# Patient Record
Sex: Female | Born: 1975 | Race: Black or African American | Hispanic: No | Marital: Single | Smoking: Never smoker
Health system: Southern US, Community
[De-identification: ages and names within clinical notes are randomized; demographics above are authoritative.]

## PROBLEM LIST (undated history)

## (undated) DIAGNOSIS — D219 Benign neoplasm of connective and other soft tissue, unspecified: Secondary | ICD-10-CM

## (undated) DIAGNOSIS — R600 Localized edema: Secondary | ICD-10-CM

## (undated) DIAGNOSIS — N83209 Unspecified ovarian cyst, unspecified side: Secondary | ICD-10-CM

## (undated) DIAGNOSIS — M2550 Pain in unspecified joint: Secondary | ICD-10-CM

## (undated) DIAGNOSIS — E785 Hyperlipidemia, unspecified: Secondary | ICD-10-CM

## (undated) DIAGNOSIS — M549 Dorsalgia, unspecified: Secondary | ICD-10-CM

## (undated) DIAGNOSIS — R030 Elevated blood-pressure reading, without diagnosis of hypertension: Secondary | ICD-10-CM

## (undated) DIAGNOSIS — F32A Depression, unspecified: Secondary | ICD-10-CM

## (undated) DIAGNOSIS — E041 Nontoxic single thyroid nodule: Secondary | ICD-10-CM

## (undated) DIAGNOSIS — J302 Other seasonal allergic rhinitis: Secondary | ICD-10-CM

## (undated) DIAGNOSIS — F419 Anxiety disorder, unspecified: Secondary | ICD-10-CM

## (undated) DIAGNOSIS — N946 Dysmenorrhea, unspecified: Secondary | ICD-10-CM

## (undated) HISTORY — DX: Elevated blood-pressure reading, without diagnosis of hypertension: R03.0

## (undated) HISTORY — PX: CERVICAL CERCLAGE: SHX1329

## (undated) HISTORY — DX: Unspecified ovarian cyst, unspecified side: N83.209

## (undated) HISTORY — DX: Dorsalgia, unspecified: M54.9

## (undated) HISTORY — DX: Benign neoplasm of connective and other soft tissue, unspecified: D21.9

## (undated) HISTORY — DX: Nontoxic single thyroid nodule: E04.1

## (undated) HISTORY — DX: Localized edema: R60.0

## (undated) HISTORY — DX: Anxiety disorder, unspecified: F41.9

## (undated) HISTORY — DX: Pain in unspecified joint: M25.50

## (undated) HISTORY — DX: Depression, unspecified: F32.A

## (undated) HISTORY — DX: Dysmenorrhea, unspecified: N94.6

## (undated) HISTORY — DX: Hyperlipidemia, unspecified: E78.5

## (undated) HISTORY — DX: Other seasonal allergic rhinitis: J30.2

---

## 2016-07-02 DIAGNOSIS — N946 Dysmenorrhea, unspecified: Secondary | ICD-10-CM | POA: Insufficient documentation

## 2016-08-07 DIAGNOSIS — E063 Autoimmune thyroiditis: Secondary | ICD-10-CM | POA: Insufficient documentation

## 2017-04-10 DIAGNOSIS — R635 Abnormal weight gain: Secondary | ICD-10-CM | POA: Insufficient documentation

## 2017-10-06 ENCOUNTER — Emergency Department (HOSPITAL_COMMUNITY): Payer: 59

## 2017-10-06 ENCOUNTER — Emergency Department (HOSPITAL_COMMUNITY)
Admission: EM | Admit: 2017-10-06 | Discharge: 2017-10-06 | Disposition: A | Discharge status: Home / Self Care | Payer: 59 | Attending: Emergency Medicine | Admitting: Emergency Medicine

## 2017-10-06 ENCOUNTER — Other Ambulatory Visit: Payer: Self-pay

## 2017-10-06 ENCOUNTER — Encounter (HOSPITAL_COMMUNITY): Payer: Self-pay | Admitting: Emergency Medicine

## 2017-10-06 DIAGNOSIS — N83201 Unspecified ovarian cyst, right side: Secondary | ICD-10-CM | POA: Insufficient documentation

## 2017-10-06 DIAGNOSIS — Z79899 Other long term (current) drug therapy: Secondary | ICD-10-CM | POA: Diagnosis not present

## 2017-10-06 DIAGNOSIS — D259 Leiomyoma of uterus, unspecified: Secondary | ICD-10-CM | POA: Insufficient documentation

## 2017-10-06 DIAGNOSIS — R1031 Right lower quadrant pain: Secondary | ICD-10-CM | POA: Diagnosis present

## 2017-10-06 LAB — URINALYSIS, ROUTINE W REFLEX MICROSCOPIC
Bacteria, UA: NONE SEEN
Bilirubin Urine: NEGATIVE
GLUCOSE, UA: NEGATIVE mg/dL
Hgb urine dipstick: NEGATIVE
Ketones, ur: NEGATIVE mg/dL
Leukocytes, UA: NEGATIVE
Nitrite: NEGATIVE
PH: 7 (ref 5.0–8.0)
Protein, ur: NEGATIVE mg/dL
SPECIFIC GRAVITY, URINE: 1.021 (ref 1.005–1.030)

## 2017-10-06 LAB — COMPREHENSIVE METABOLIC PANEL
ALK PHOS: 37 U/L — AB (ref 38–126)
ALT: 13 U/L — ABNORMAL LOW (ref 14–54)
AST: 19 U/L (ref 15–41)
Albumin: 3.4 g/dL — ABNORMAL LOW (ref 3.5–5.0)
Anion gap: 7 (ref 5–15)
BUN: 11 mg/dL (ref 6–20)
CALCIUM: 9.1 mg/dL (ref 8.9–10.3)
CHLORIDE: 106 mmol/L (ref 101–111)
CO2: 26 mmol/L (ref 22–32)
CREATININE: 0.9 mg/dL (ref 0.44–1.00)
Glucose, Bld: 90 mg/dL (ref 65–99)
Potassium: 4.3 mmol/L (ref 3.5–5.1)
Sodium: 139 mmol/L (ref 135–145)
Total Bilirubin: 0.6 mg/dL (ref 0.3–1.2)
Total Protein: 6.7 g/dL (ref 6.5–8.1)

## 2017-10-06 LAB — CBC
HCT: 40.2 % (ref 36.0–46.0)
Hemoglobin: 13.1 g/dL (ref 12.0–15.0)
MCH: 29.5 pg (ref 26.0–34.0)
MCHC: 32.6 g/dL (ref 30.0–36.0)
MCV: 90.5 fL (ref 78.0–100.0)
PLATELETS: 290 10*3/uL (ref 150–400)
RBC: 4.44 MIL/uL (ref 3.87–5.11)
RDW: 13.4 % (ref 11.5–15.5)
WBC: 6.4 10*3/uL (ref 4.0–10.5)

## 2017-10-06 LAB — I-STAT BETA HCG BLOOD, ED (MC, WL, AP ONLY): I-stat hCG, quantitative: <5 m[IU]/mL (ref <5)

## 2017-10-06 LAB — LIPASE, BLOOD: LIPASE: 28 U/L (ref 11–51)

## 2017-10-06 MED ORDER — IOHEXOL 300 MG/ML  SOLN
100.0000 mL | Freq: Once | INTRAMUSCULAR | Status: AC | PRN
  Administered 2017-10-06: 100 mL via INTRAVENOUS

## 2017-10-06 NOTE — ED Triage Notes (Signed)
Pt. Stated, Ive had constipation with stomach pain on the right for 3 days ago.

## 2017-10-06 NOTE — ED Provider Notes (Signed)
[facility] EMERGENCY DEPARTMENT Provider Note   CSN: [id number] Arrival date & time: 10/06/17  0735   History   Chief Complaint Chief Complaint  Patient presents with  . Abdominal Pain  . Constipation    HPI Anita Padilla is a 42 y.o. female.  HPI   42 year old female presents today with complaints of constipation.  She notes over the last 3 days she has had very little bowel movements and eating very hard small stool.  Patient denies any bleeding, denies any vaginal discharge or bleeding.  She denies any upper abdominal pain nausea or vomiting, denies any fever.   History reviewed. No pertinent past medical history.  There are no active problems to display for this patient.   History reviewed. No pertinent surgical history.   OB History   None      Home Medications    Prior to Admission medications   Medication Sig Start Date End Date Taking? Authorizing Provider  Multiple Vitamins-Minerals (MULTIVITAMIN WITH MINERALS) tablet Take 1 tablet by mouth daily.   Yes [provider]  norgestimate-ethinyl estradiol (Dahlgren 28) 0.25-35 MG-MCG tablet Take 1 tablet by mouth every evening. 08/20/17  Yes [provider]    Family History No family history on file.  Social History Social History   Tobacco Use  . Smoking status: Never Smoker  . Smokeless tobacco: Never Used  Substance Use Topics  . Alcohol use: Not Currently  . Drug use: Not Currently     Allergies   Patient has no known allergies.   Review of Systems Review of Systems  All other systems reviewed and are negative.  Physical Exam Updated Vital Signs BP 121/83 (BP Location: Right Arm)   Pulse 64   Temp 98.1 F (36.7 C) (Oral)   Resp 16   Ht 5\' 8"  (1.727 m)   Wt 97.1 kg (214 lb)   LMP 09/07/2017   SpO2 99%   BMI 32.54 kg/m   Physical Exam  Constitutional: She is oriented to person, place, and time. She appears well-developed and well-nourished.    HENT:  Head: Normocephalic and atraumatic.  Eyes: Pupils are equal, round, and reactive to light. Conjunctivae are normal. Right eye exhibits no discharge. Left eye exhibits no discharge. No scleral icterus.  Neck: Normal range of motion. No JVD present. No tracheal deviation present.  Pulmonary/Chest: Effort normal. No stridor.  Abdominal:  Large abdominal mass felt in the lower mid pelvis, minor tenderness palpation, remainder of abdomen soft nontender  Neurological: She is alert and oriented to person, place, and time. Coordination normal.  Psychiatric: She has a normal mood and affect. Her behavior is normal. Judgment and thought content normal.  Nursing note and vitals reviewed.   ED Treatments / Results  Labs (all labs ordered are listed, but only abnormal results are displayed) Labs Reviewed  COMPREHENSIVE METABOLIC PANEL - Abnormal; Notable for the following components:      Result Value   Albumin 3.4 (*)    ALT 13 (*)    Alkaline Phosphatase 37 (*)    All other components within normal limits  LIPASE, BLOOD  CBC  URINALYSIS, ROUTINE W REFLEX MICROSCOPIC  I-STAT BETA HCG BLOOD, ED ([facility], [facility], [facility] ONLY)    EKG None  Radiology No results found.  Procedures Procedures (including critical care time)  Medications Ordered in ED Medications  iohexol (OMNIPAQUE) 300 MG/ML solution 100 mL (100 mLs Intravenous Contrast Given 10/06/17 1357)     Initial Impression /  Assessment and Plan / ED Course  I have reviewed the triage vital signs and the nursing notes.  Pertinent labs & imaging results that were available during my care of the patient were reviewed by me and considered in my medical decision making (see chart for details).     Labs: I stat beta, lipase, CMP, CBC, UA  Imaging: CT abd and pelvis   Consults:  Therapeutics:  Discharge Meds:   Assessment/Plan: 42 year old female presents today with complaints of abdominal pain and constipation.  Patient is  having bowel movements although smaller and firm.  Patient CT scan shows enlarged uterus with a large mass, also right-sided fibroid.  Patient also with complicated cystic mass on the right with a question of ovarian neoplasm.  Patient has no signs of obstruction on exam, labs are reassuring.  All results were read to the patient she will follow-up closely with her primary care OB/GYN for ongoing evaluation and further imaging with MRI.  If she develops any new or worsening signs or symptoms she will return immediately to the emergency room or if she is unable to complete the studies as an outpatient.  Patient verbalized understanding and agreement to today's plan had no further questions or concerns at time of discharge.     Final Clinical Impressions(s) / ED Diagnoses   Final diagnoses:  Cyst of right ovary  Uterine leiomyoma, unspecified location    ED Discharge Orders    None       Francee Gentile 10/08/17 1802    Margette Fast, MD 10/09/17 [phone]

## 2017-10-06 NOTE — ED Notes (Signed)
Patient verbalized understanding of discharge instructions and denies any further needs or questions at this time. VS stable. Patient ambulatory with steady gait.  

## 2017-10-06 NOTE — Discharge Instructions (Signed)
Please read attached information. If you experience any new or worsening signs or symptoms please return to the emergency room for evaluation.  Please follow-up with OB/GYN specialist as indicated, please contact them today and schedule follow-up evaluation.  If you are unable to follow-up with OB/GYN contact your primary care or return here to the emergency room.

## 2017-10-08 ENCOUNTER — Telehealth: Payer: Self-pay | Admitting: Obstetrics & Gynecology

## 2017-10-08 ENCOUNTER — Other Ambulatory Visit: Payer: Self-pay | Admitting: Obstetrics & Gynecology

## 2017-10-08 DIAGNOSIS — N83209 Unspecified ovarian cyst, unspecified side: Secondary | ICD-10-CM | POA: Insufficient documentation

## 2017-10-08 MED ORDER — PROMETHAZINE HCL 12.5 MG PO TABS: 12.5000 mg | ORAL_TABLET | Freq: Four times a day (QID) | ORAL | 0 refills | Status: DC | PRN

## 2017-10-08 MED ORDER — NORGESTIMATE-ETH ESTRADIOL 0.25-35 MG-MCG PO TABS: 1.0000 | ORAL_TABLET | Freq: Every evening | ORAL | 0 refills | Status: DC

## 2017-10-08 MED ORDER — IBUPROFEN 800 MG PO TABS: 800.0000 mg | ORAL_TABLET | Freq: Three times a day (TID) | ORAL | 0 refills | Status: DC | PRN

## 2017-10-08 NOTE — Progress Notes (Signed)
42 yo G1P0 AA who was seen in office today by Dr. Leo Grosser due to recent ER visit where CT was done showing 10cm leiomyoma and 8.2cm cyst with murla nodularity.  Reviewed ER note and CT while on phone with pt.    Pt reports she had so many questions today and she forgot to ask about pain management and nausea that is occurring with the pain.  Slso, she needs a refill on her OCPs.    Advised I cannot call in any narcotic but feel comfortable prescription motrin and phenergan.  These were sent to the pharmacy pt requested.  Dosing and administration instructions given.  I did also send in one refill for her Sprintec.    Advised I would send a note to Dr. Leo Grosser but also advised pt to call morning if needs better pain management recommendations are needed.  Pt appreciative of phone call.

## 2017-10-08 NOTE — Telephone Encounter (Signed)
Phone notd documented.

## 2017-10-09 ENCOUNTER — Other Ambulatory Visit: Payer: Self-pay | Admitting: Obstetrics and Gynecology

## 2017-10-09 DIAGNOSIS — N838 Other noninflammatory disorders of ovary, fallopian tube and broad ligament: Secondary | ICD-10-CM

## 2017-10-09 DIAGNOSIS — D259 Leiomyoma of uterus, unspecified: Secondary | ICD-10-CM | POA: Insufficient documentation

## 2017-10-09 DIAGNOSIS — R102 Pelvic and perineal pain: Secondary | ICD-10-CM | POA: Insufficient documentation

## 2017-10-15 ENCOUNTER — Other Ambulatory Visit: Payer: 59

## 2017-10-18 ENCOUNTER — Ambulatory Visit
Admission: RE | Admit: 2017-10-18 | Discharge: 2017-10-18 | Disposition: A | Discharge status: Home / Self Care | Payer: 59 | Source: Ambulatory Visit | Attending: Obstetrics and Gynecology | Admitting: Obstetrics and Gynecology

## 2017-10-18 DIAGNOSIS — N838 Other noninflammatory disorders of ovary, fallopian tube and broad ligament: Secondary | ICD-10-CM

## 2017-10-18 MED ORDER — GADOBENATE DIMEGLUMINE 529 MG/ML IV SOLN
20.0000 mL | Freq: Once | INTRAVENOUS | Status: AC | PRN
  Administered 2017-10-18: 20 mL via INTRAVENOUS

## 2017-10-26 ENCOUNTER — Encounter: Payer: Self-pay | Admitting: Obstetrics and Gynecology

## 2017-10-26 ENCOUNTER — Other Ambulatory Visit: Payer: Self-pay

## 2017-10-26 ENCOUNTER — Ambulatory Visit: Payer: 59 | Admitting: Obstetrics and Gynecology

## 2017-10-26 ENCOUNTER — Encounter

## 2017-10-26 VITALS — BP 122/81 | HR 64 | Ht 68.0 in | Wt 224.4 lb

## 2017-10-26 DIAGNOSIS — D259 Leiomyoma of uterus, unspecified: Secondary | ICD-10-CM

## 2017-10-26 DIAGNOSIS — N946 Dysmenorrhea, unspecified: Secondary | ICD-10-CM

## 2017-10-26 DIAGNOSIS — N920 Excessive and frequent menstruation with regular cycle: Secondary | ICD-10-CM

## 2017-10-26 DIAGNOSIS — N839 Noninflammatory disorder of ovary, fallopian tube and broad ligament, unspecified: Secondary | ICD-10-CM

## 2017-10-26 DIAGNOSIS — N838 Other noninflammatory disorders of ovary, fallopian tube and broad ligament: Secondary | ICD-10-CM

## 2017-10-26 DIAGNOSIS — E041 Nontoxic single thyroid nodule: Secondary | ICD-10-CM | POA: Insufficient documentation

## 2017-10-26 NOTE — Progress Notes (Signed)
42 y.o. G1P0010  SingleAfrican AmericanF here to discuss switching care. Recently seen in the ED with pain and was diagnosed with a fibroid uterus and a complicated 8 cm cyst of ovary on CT scan. A MRI was done last week. The MRI measured the uterus at 17.8 x 10.7 x 12.7 cm, with an 11.7 x 8.2 x 8.8 cm fibroid that deviates the cavity.  There was an complex right ovarian cyst mass: "9.9 cm right ovarian cystic lesion which has indeterminate but probably benign characteristics. Differential diagnosis includes cystic ovarian neoplasm and physiologic cysts. Recommend correlation with tumor markers, and consider surgical evaluation versus follow-up by MRI in 8-12 weeks" Tumor markers, including HER4, CEA, and CA 125 were all normal.  The patient had a Hgb of 13.1 last month.   The patient c/o a progressive cramping, constant BLQ abdominal pain over the last 6 months. The pain radiates from the RLQ to the LLQ, worse when she bends or sleeps on her right side. The pain is up to a 6-7/10 in severity. She is taking motrin and tylenol for the pain and phenergan intermittently for nausea. She has had nausea in the morning for the last few months. Only taking phenergan as needed.  She c/o frequent urination.  She had a pregnancy loss at 18 weeks, had a cerclage, which failed.   Would like to have a baby, not currently sexually active.   Period Duration (Days): 5 days in length Period Pattern: Regular Menstrual Flow: Heavy, Moderate Menstrual Control: Maxi pad, Thin pad Menstrual Control Change Freq (Hours): changes pad every hour Dysmenorrhea: (!) Severe Dysmenorrhea Symptoms: Cramping  On OCP's.   Patient's last menstrual period was 10/16/2017 (exact date).          Sexually active: No.  The current method of family planning is OCP (estrogen/progesterone).    Exercising: No.  The patient does not participate in regular exercise at present. Smoker:  no  Health Maintenance: Pap:  April 2018  normal pre patient History of abnormal Pap:  no TDaP:  Patient is up to date unsure of when this was given Gardasil: N/A   reports that she has never smoked. She has never used smokeless tobacco. She reports that she drinks alcohol. She reports that she has current or past drug history.  Past Medical History:  Diagnosis Date  . Dysmenorrhea   . Fibroid   . Ovarian cyst   . Thyroid nodule   Patient reports having a benign thyroid biopsy last year  Past Surgical History:  Procedure Laterality Date  . CERVICAL CERCLAGE      Current Outpatient Medications  Medication Sig Dispense Refill  . ibuprofen (ADVIL,MOTRIN) 800 MG tablet Take 1 tablet (800 mg total) by mouth every 8 (eight) hours as needed. 30 tablet 0  . Multiple Vitamins-Minerals (MULTIVITAMIN WITH MINERALS) tablet Take 1 tablet by mouth daily.    . norgestimate-ethinyl estradiol (SPRINTEC 28) 0.25-35 MG-MCG tablet Take 1 tablet by mouth every evening. 1 Package 0  . promethazine (PHENERGAN) 12.5 MG tablet Take 1 tablet (12.5 mg total) by mouth every 6 (six) hours as needed for nausea or vomiting. 30 tablet 0   No current facility-administered medications for this visit.     Family History  Problem Relation Age of Onset  . Hypertension Mother   . Thyroid disease Mother     Review of Systems  Constitutional: Negative.   HENT: Negative.   Eyes: Negative.   Respiratory: Negative.   Cardiovascular: Negative.  Gastrointestinal: Negative.   Endocrine: Negative.   Genitourinary: Positive for pelvic pain and vaginal pain.  Musculoskeletal: Negative.   Skin: Negative.   Allergic/Immunologic: Negative.   Neurological: Negative.   Hematological: Negative.   Psychiatric/Behavioral: Negative.     Exam:   BP 122/81 (BP Location: Right Arm, Patient Position: Sitting)   Pulse 64   Ht 5\' 8"  (1.727 m)   Wt 224 lb 6.4 oz (101.8 kg)   LMP 10/16/2017 (Exact Date)   BMI 34.12 kg/m   Weight change: @WEIGHTCHANGE @ Height:    Height: 5\' 8"  (172.7 cm)  Ht Readings from Last 3 Encounters:  10/26/17 5\' 8"  (1.727 m)  10/06/17 5\' 8"  (1.727 m)    General appearance: alert, cooperative and appears stated age Head: Normocephalic, without obvious abnormality, atraumatic Neck: no adenopathy, supple, symmetrical, trachea midline and thyroid very enlarged right lobe of the thyroid, normal on the left Lungs: clear to auscultation bilaterally Cardiovascular: regular rate and rhythm Abdomen: soft, non-tender; non distended,  no masses,  no organomegaly Extremities: extremities normal, atraumatic, no cyanosis or edema Skin: Skin color, texture, turgor normal. No rashes or lesions Lymph nodes: Cervical, supraclavicular, and axillary nodes normal. No abnormal inguinal nodes palpated Neurologic: Grossly normal   Pelvic: External genitalia:  no lesions              Urethra:  normal appearing urethra with no masses, tenderness or lesions              Bartholins and Skenes: normal                 Vagina: normal appearing vagina with normal color and discharge, no lesions              Cervix: no cervical motion tenderness and no lesions               Bimanual Exam:  Uterus:  anteverted, mobile, mildly tender, 16 week sized              Adnexa: no clear mass felt, mildly tender on the right               Rectovaginal: Confirms               Anus:  normal sphincter tone, no lesions  Chaperone was present for exam.  MRI images reviewed with the patient  A:  Large, complex right ovarian mass, benign tumor markers  Fibroid uterus, 16 week sized  Menorrhagia, but not anemic on OCP's  Severe dysmenorrhea  Abdominal/pelvic pain, uterus and right adnexa tender  Desire for fertility  H/O incompetent cervix with pregnancy loss at 18 weeks  P:   Recommend removal of right ovary  Given that she is not anemic, she would not have to have a hysterectomy, or myomectomy, but given her menorrhagia, severe dysmenorrhea and pain, she  would likely benefit from surgery  Laparoscopic hysterectomy is not a good option given her desire for fertility  Given her desire for fertility, will set up a consultation with Dr Kerin Perna to discuss possible myomectomy, right oophorectomy , and future conception   Discussed laparoscopy, recovery times with different procedures    ~30 minutes was spent face to face with the patient, over 50% in counseling

## 2017-10-27 ENCOUNTER — Telehealth: Payer: Self-pay

## 2017-10-27 DIAGNOSIS — Z319 Encounter for procreative management, unspecified: Secondary | ICD-10-CM

## 2017-10-27 DIAGNOSIS — D259 Leiomyoma of uterus, unspecified: Secondary | ICD-10-CM

## 2017-10-27 NOTE — Telephone Encounter (Signed)
My Chart message from patient:  Message   ----- Message from [place], Generic sent at 10/26/2017 5:07 PM EDT -----    Do I need to call to set up the appointment with Dr. Kerin Perna or has someone from the office already reached out? Thank you

## 2017-10-27 NOTE — Telephone Encounter (Signed)
Patient's appointment with Dr Kerin Perna is scheduled for 12/16/17. She would like nurse to call and see if she can get in sooner or just have Dr Talbert Nan see her for the situation.

## 2017-10-27 NOTE — Telephone Encounter (Signed)
See phone encounter. Referral has ben initiated and process explained to patient.   Encounter closed.

## 2017-10-27 NOTE — Telephone Encounter (Signed)
Referral placed to [facility]. Office visit note and referral form faxed.  Left message for Melissa patient scheduler for Dr.Yalcinkaya requesting a return call to assist with scheduling an appointment for patient. Patient is aware this has been completed and is process of scheduling.

## 2017-10-28 MED ORDER — ONDANSETRON HCL 4 MG PO TABS: 4.0000 mg | ORAL_TABLET | Freq: Three times a day (TID) | ORAL | 0 refills | Status: DC | PRN

## 2017-10-28 NOTE — Telephone Encounter (Signed)
Review with Dr Talbert Nan.Call to Dr Charlett Lango office, left message with triage line requesting earlier appointment due to patient pain level.  Call to patient. Update provided. Patient requests medication for nausea that can be taken during daytime hours, Phenergan makes her to sleepy during day. Has done well with Zofran in past.

## 2017-10-28 NOTE — Telephone Encounter (Signed)
Call to patient to confirm new appointment information.  Pere ROI can leave message on voice mail, number confirmed on call log. Left message advising of new appointment and Zofran sent to pharmacy by Dr Talbert Nan. Call back for further questions.   Encounter closed.

## 2017-10-28 NOTE — Telephone Encounter (Signed)
Dr. Charlett Lango office returning call to [facility]. Stated that they are going to work the patient in next Wednesday, July 10.

## 2017-10-28 NOTE — Telephone Encounter (Signed)
Thank you!! Zofran has been sent.

## 2017-10-28 NOTE — Telephone Encounter (Signed)
Patient stated that she cannot get in to see Dr. Janine Limbo until August 21. She does not feel comfortable waiting that long, when "Dr. Talbert Nan verbalized that the surgery needed to be done as soon as possible." Patient wanting Dr. Gentry Fitz opinion on whether she should wait until August or if she should try to get in sooner with another doctor.

## 2017-11-09 ENCOUNTER — Encounter: Payer: 59 | Admitting: Obstetrics & Gynecology

## 2018-07-06 IMAGING — CT CT ABD-PELV W/ CM
2 of 4 series · 16 of 46 positions shown, 18 images · IV contrast (APPLIED)
Comparison: None

CLINICAL DATA: Non pulsatile abdominal mass, abdominal pain on
RIGHT side for 3 days

EXAM:
CT ABDOMEN AND PELVIS WITH CONTRAST
TECHNIQUE: Multidetector CT imaging of the abdomen and pelvis was performed
using the standard protocol following bolus administration of
intravenous contrast.
CONTRAST:  100mL OMNIPAQUE IOHEXOL 300 MG/ML SOLN IV

[Series 3: abd/ pelvis 5.0 i30f 2 · axial · 0.89mm/px · z∈[+900,+1355]mm · 13 of 101 slices shown, 15 images]
[im 5/101  soft-tissue]
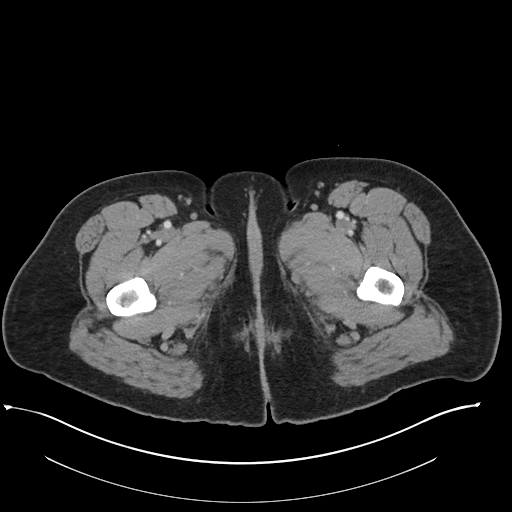
[im 5/101  bone]
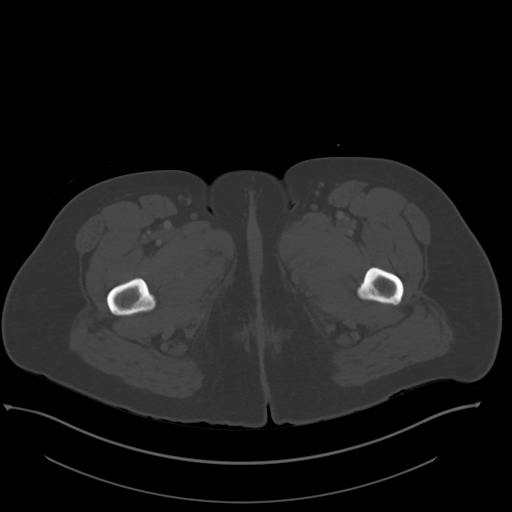
[im 13/101  soft-tissue]
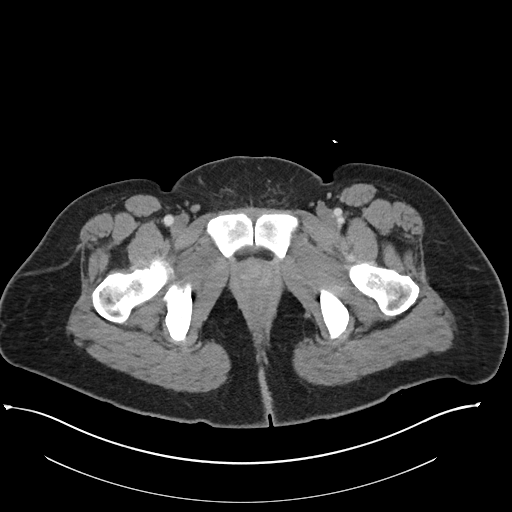
[im 21/101  soft-tissue]
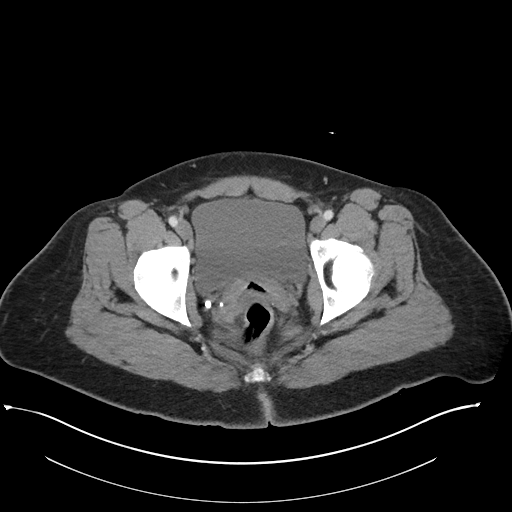
[im 30/101  soft-tissue]
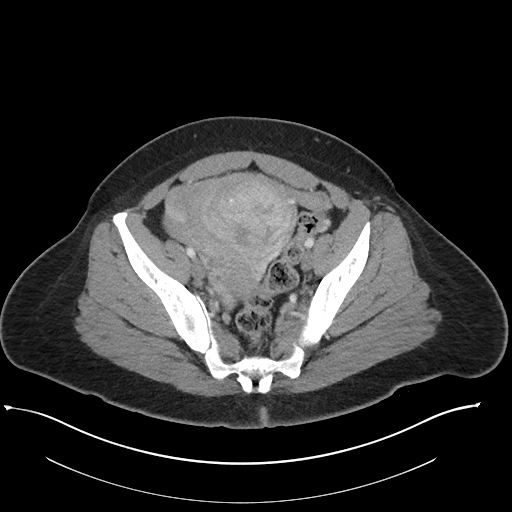
[im 34/101  soft-tissue]
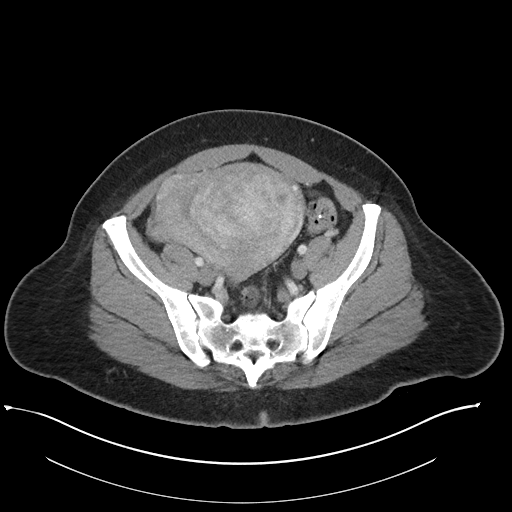
[im 42/101  soft-tissue]
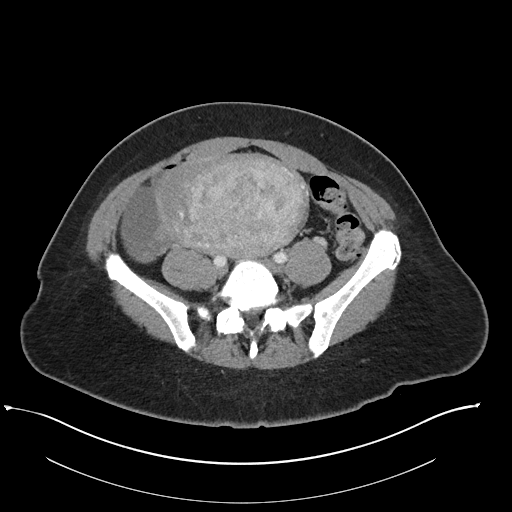
[im 51/101  soft-tissue]
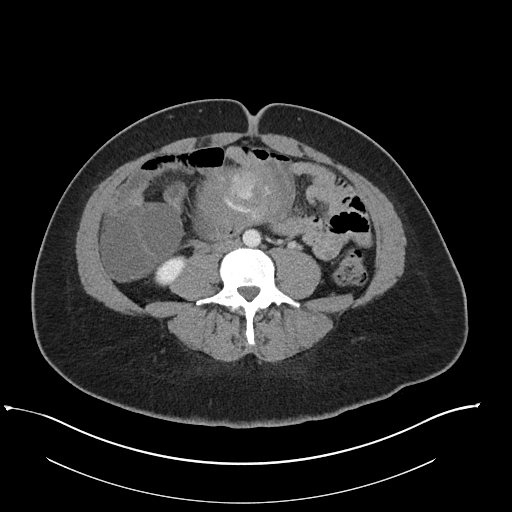
[im 59/101  soft-tissue]
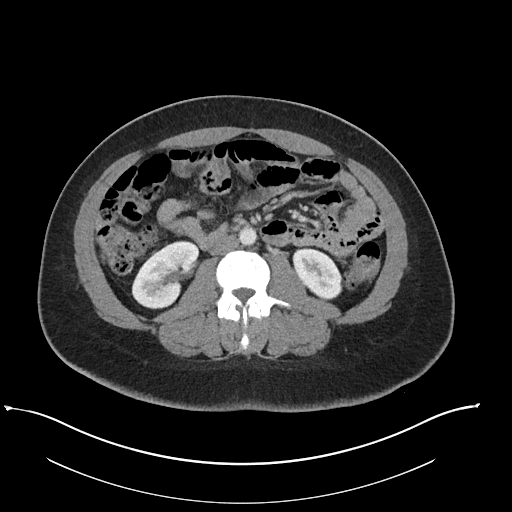
[im 67/101  soft-tissue]
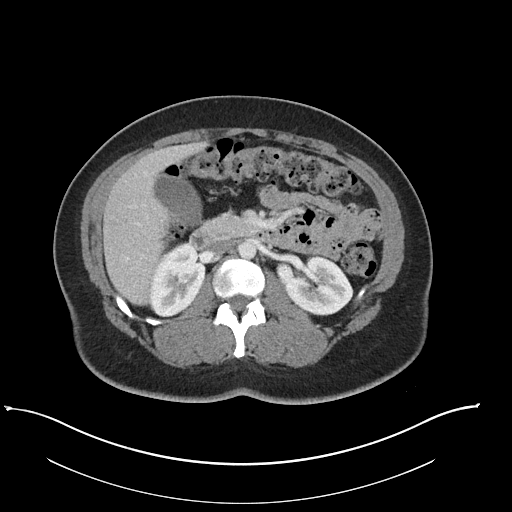
[im 67/101  bone]
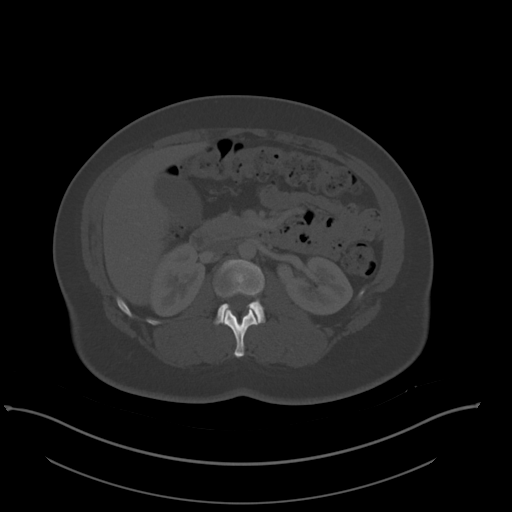
[im 71/101  soft-tissue]
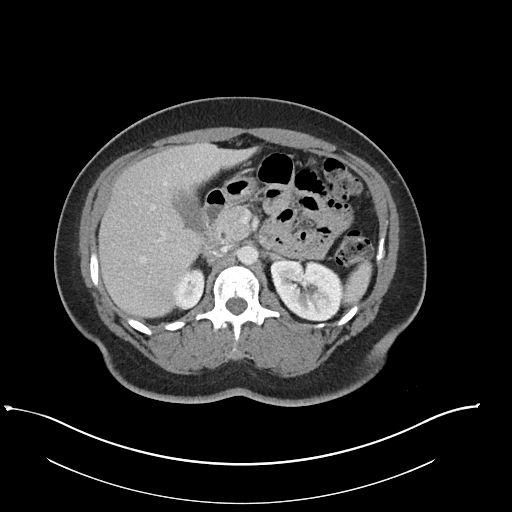
[im 80/101  soft-tissue]
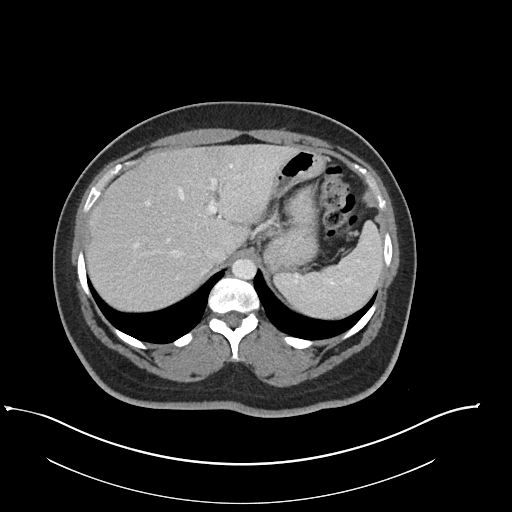
[im 88/101  soft-tissue]
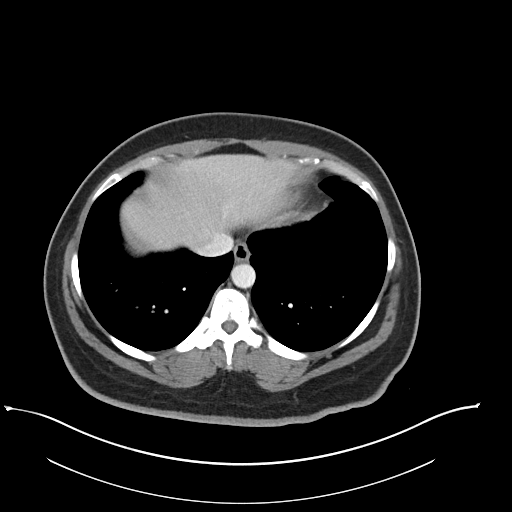
[im 96/101  soft-tissue]
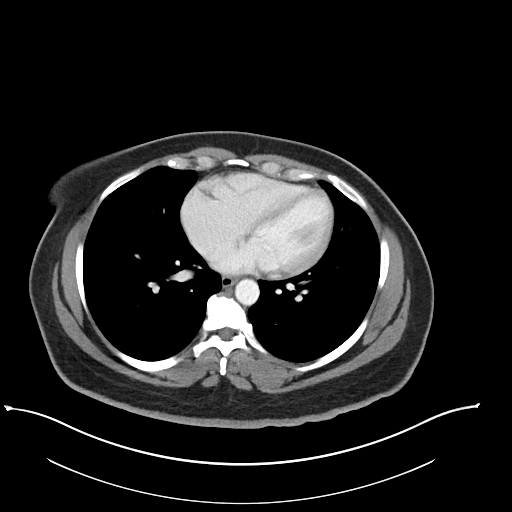

[Series 6: coronal soft tissue · coronal · 0.87mm/px · 3 of 107 slices shown]
[im 36/107  soft-tissue]
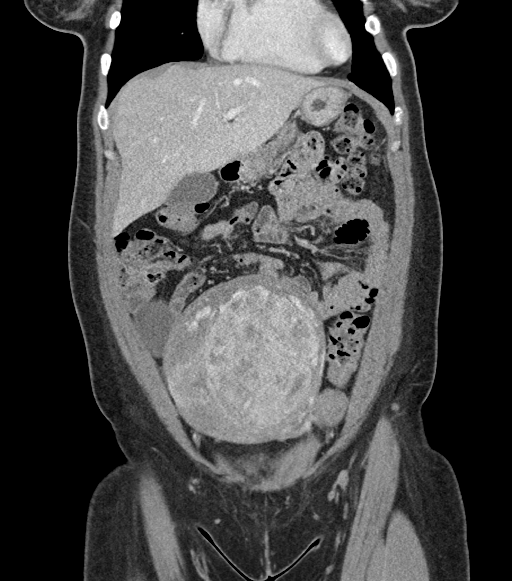
[im 48/107  soft-tissue]
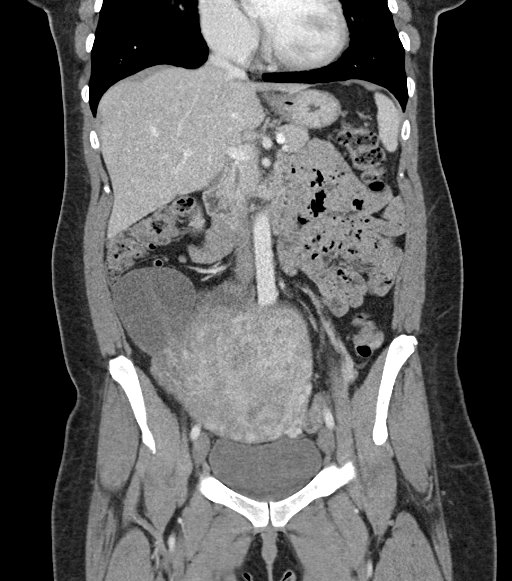
[im 59/107  soft-tissue]
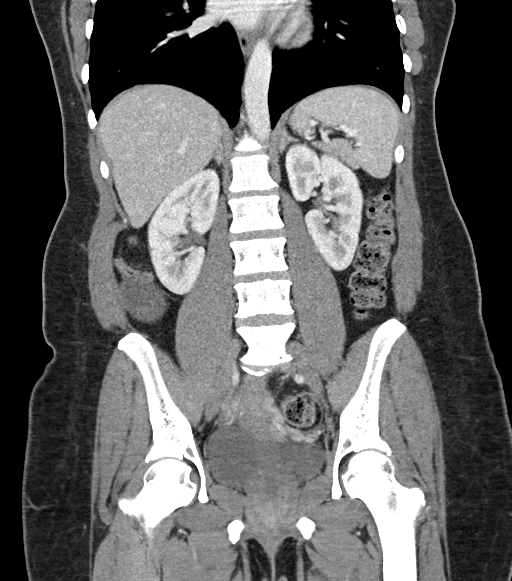

[16 of 46 positions shown; findings below may reference images not displayed]

FINDINGS: Lower chest: 4 mm nodule in inferior RIGHT upper lobe near base of
major fissure image 9. Lung bases otherwise clear.

Hepatobiliary: Gallbladder and liver normal appearance

Pancreas: Normal appearance

Spleen: Normal appearance

Adrenals/Urinary Tract: Normal appearing adrenal glands, kidneys,
ureters, and bladder

Stomach/Bowel: Normal appendix. Large and small bowel loops
unremarkable. Stomach decompressed.

Vascular/Lymphatic: Vascular structures patent. Scattered pelvic
phleboliths. No adenopathy.

Reproductive: Enlarged uterus containing a large mass question
leiomyoma measuring 9.6 x 7.3 x 12.1 cm. Probable additional tiny
leiomyomata RIGHT lateral uterus. Unremarkable LEFT adnexa.
Complicated cystic mass RIGHT ovary 8.2 x 6.1 x 7.6 cm with
septations and question mural nodularity.

Other: No free air or free fluid. No hernia or inflammatory process.

Musculoskeletal: No acute osseous findings.
IMPRESSION: Enlarged uterus containing a large mass 9.6 x 7.3 x 12.1 cm likely
large leiomyoma.

Additional probable small RIGHT-side leiomyomata.

Complicated cystic mass RIGHT ovary 8.2 x 6.1 x 7.6 cm in size
septations and question mural nodularity; characterization by MRI
recommended to exclude ovarian neoplasm.

## 2018-10-12 ENCOUNTER — Other Ambulatory Visit: Payer: Self-pay

## 2018-10-12 ENCOUNTER — Encounter (HOSPITAL_COMMUNITY): Payer: Self-pay | Admitting: Emergency Medicine

## 2018-10-12 ENCOUNTER — Emergency Department (HOSPITAL_COMMUNITY)
Admission: EM | Admit: 2018-10-12 | Discharge: 2018-10-12 | Disposition: A | Discharge status: Home / Self Care | Payer: 59 | Attending: Emergency Medicine | Admitting: Emergency Medicine

## 2018-10-12 DIAGNOSIS — Z20828 Contact with and (suspected) exposure to other viral communicable diseases: Secondary | ICD-10-CM | POA: Insufficient documentation

## 2018-10-12 DIAGNOSIS — R110 Nausea: Secondary | ICD-10-CM | POA: Diagnosis not present

## 2018-10-12 DIAGNOSIS — Z79899 Other long term (current) drug therapy: Secondary | ICD-10-CM | POA: Diagnosis not present

## 2018-10-12 DIAGNOSIS — R509 Fever, unspecified: Secondary | ICD-10-CM | POA: Diagnosis present

## 2018-10-12 LAB — COMPREHENSIVE METABOLIC PANEL
ALT: 13 U/L (ref 0–44)
AST: 19 U/L (ref 15–41)
Albumin: 3.5 g/dL (ref 3.5–5.0)
Alkaline Phosphatase: 46 U/L (ref 38–126)
Anion gap: 7 (ref 5–15)
BUN: <5 mg/dL — ABNORMAL LOW (ref 6–20)
CO2: 23 mmol/L (ref 22–32)
Calcium: 8.9 mg/dL (ref 8.9–10.3)
Chloride: 109 mmol/L (ref 98–111)
Creatinine, Ser: 0.97 mg/dL (ref 0.44–1.00)
GFR calc Af Amer: >60 mL/min (ref >60)
GFR calc non Af Amer: >60 mL/min (ref >60)
Glucose, Bld: 89 mg/dL (ref 70–99)
Potassium: 4.1 mmol/L (ref 3.5–5.1)
Sodium: 139 mmol/L (ref 135–145)
Total Bilirubin: 0.7 mg/dL (ref 0.3–1.2)
Total Protein: 6.4 g/dL — ABNORMAL LOW (ref 6.5–8.1)

## 2018-10-12 LAB — CBC WITH DIFFERENTIAL/PLATELET
Abs Immature Granulocytes: 0.01 10*3/uL (ref 0.00–0.07)
Basophils Absolute: 0 10*3/uL (ref 0.0–0.1)
Basophils Relative: 0 %
Eosinophils Absolute: 0.1 10*3/uL (ref 0.0–0.5)
Eosinophils Relative: 1 %
HCT: 40.4 % (ref 36.0–46.0)
Hemoglobin: 12.8 g/dL (ref 12.0–15.0)
Immature Granulocytes: 0 %
Lymphocytes Relative: 37 %
Lymphs Abs: 1.3 10*3/uL (ref 0.7–4.0)
MCH: 29.3 pg (ref 26.0–34.0)
MCHC: 31.7 g/dL (ref 30.0–36.0)
MCV: 92.4 fL (ref 80.0–100.0)
Monocytes Absolute: 0.4 10*3/uL (ref 0.1–1.0)
Monocytes Relative: 12 %
Neutro Abs: 1.7 10*3/uL (ref 1.7–7.7)
Neutrophils Relative %: 50 %
Platelets: 263 10*3/uL (ref 150–400)
RBC: 4.37 MIL/uL (ref 3.87–5.11)
RDW: 13.7 % (ref 11.5–15.5)
WBC: 3.5 10*3/uL — ABNORMAL LOW (ref 4.0–10.5)
nRBC: 0 % (ref 0.0–0.2)

## 2018-10-12 MED ORDER — ONDANSETRON 4 MG PO TBDP: 4.0000 mg | ORAL_TABLET | Freq: Three times a day (TID) | ORAL | 0 refills | Status: DC | PRN

## 2018-10-12 MED ORDER — ONDANSETRON 4 MG PO TBDP
8.0000 mg | ORAL_TABLET | Freq: Once | ORAL | Status: AC
  Administered 2018-10-12: 8 mg via ORAL
  Filled 2018-10-12: qty 2

## 2018-10-12 NOTE — ED Notes (Signed)
MD at bedside. 

## 2018-10-12 NOTE — ED Notes (Signed)
Patient verbalizes understanding of discharge instructions. Opportunity for questioning and answers were provided. Pt discharged from ED. 

## 2018-10-12 NOTE — ED Triage Notes (Signed)
Pt c/o fever, chills, nausea since last night. Per pt her tem at home 100.5 at home, pt denies taking any medication pta.

## 2018-10-12 NOTE — Discharge Instructions (Signed)
As discussed, your evaluation today has been largely reassuring.  But, it is important that you monitor your condition carefully, and do not hesitate to return to the ED if you develop new, or concerning changes in your condition.  Your test for the coronavirus should be available within the next 24 or 48 hours.  Otherwise, please follow-up with your physician for appropriate ongoing care.

## 2018-10-12 NOTE — ED Provider Notes (Signed)
[facility] EMERGENCY DEPARTMENT Provider Note   CSN: [id number] Arrival date & time: 10/12/18  0865    History   Chief Complaint Chief Complaint  Patient presents with  . Fever    HPI Anita Padilla is a 43 y.o. female.     HPI Patient presents with concern of nausea, fever. Patient has a history of thyroid disease, fibroids, depression, but states that she is generally well. Yesterday she began feeling nauseous. Since onset that has been persistent, including upon awakening this morning. In addition, soon after awakening she was febrile, with a temperature of 100.5. With concern for nausea, fever she presents for evaluation. She denies focal pain beyond mild headache, has taken no medication for intervention. 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 Past Medical History:  Diagnosis Date  . Dysmenorrhea   . Fibroid   . Ovarian cyst   . Thyroid nodule   . Thyroid nodule     Patient Active Problem List   Diagnosis Date Noted  . Thyroid nodule     Past Surgical History:  Procedure Laterality Date  . CERVICAL CERCLAGE       OB History    Gravida  1   Para  0   Term  0   Preterm  0   AB  1   Living  0     SAB  1   TAB      Ectopic      Multiple      Live Births               Home Medications    Prior to Admission medications   Medication Sig Start Date End Date Taking? Authorizing Provider  ibuprofen (ADVIL,MOTRIN) 800 MG tablet Take 1 tablet (800 mg total) by mouth every 8 (eight) hours as needed. 10/08/17   Megan Salon, MD  Multiple Vitamins-Minerals (MULTIVITAMIN WITH MINERALS) tablet Take 1 tablet by mouth daily.    [provider]  norgestimate-ethinyl estradiol ([place] 28) 0.25-35 MG-MCG tablet Take 1 tablet by mouth every evening. 10/08/17   Megan Salon, MD  ondansetron (ZOFRAN) 4 MG tablet Take 1 tablet (4  mg total) by mouth every 8 (eight) hours as needed for nausea or vomiting. 10/28/17   Salvadore Dom, MD  promethazine (PHENERGAN) 12.5 MG tablet Take 1 tablet (12.5 mg total) by mouth every 6 (six) hours as needed for nausea or vomiting. 10/08/17   Megan Salon, MD    Family History Family History  Problem Relation Age of Onset  . Hypertension Mother   . Thyroid disease Mother     Social History Social History   Tobacco Use  . Smoking status: Never Smoker  . Smokeless tobacco: Never Used  Substance Use Topics  . Alcohol use: Yes    Comment: socially  . Drug use: Not Currently     Allergies   Patient has no known allergies.   Review of Systems Review of Systems  Constitutional:       Per HPI, otherwise negative  HENT:       Per HPI, otherwise negative  Respiratory:       Per HPI, otherwise negative  Cardiovascular:       Per HPI, otherwise negative  Gastrointestinal: Positive for nausea. Negative for vomiting.  Endocrine:       Negative aside from HPI  Genitourinary:  Neg aside from HPI   Musculoskeletal:       Per HPI, otherwise negative  Skin: Negative.   Neurological: Negative for syncope.  Psychiatric/Behavioral:       Depression     Physical Exam Updated Vital Signs BP 120/88   Pulse 69   Temp 98.8 F (37.1 C) (Oral)   Resp 19   Ht 5\' 8"  (1.727 m)   Wt 104.3 kg   SpO2 97%   BMI 34.97 kg/m   Physical Exam Vitals signs and nursing note reviewed.  Constitutional:      General: She is not in acute distress.    Appearance: She is well-developed.  HENT:     Head: Normocephalic and atraumatic.  Eyes:     Conjunctiva/sclera: Conjunctivae normal.  Cardiovascular:     Rate and Rhythm: Normal rate and regular rhythm.  Pulmonary:     Effort: Pulmonary effort is normal. No respiratory distress.     Breath sounds: Normal breath sounds. No stridor.  Abdominal:     General: There is no distension.     Tenderness: There is no abdominal  tenderness. There is no guarding.  Skin:    General: Skin is warm and dry.  Neurological:     Mental Status: She is alert and oriented to person, place, and time.     Cranial Nerves: No cranial nerve deficit.      ED Treatments / Results  Labs (all labs ordered are listed, but only abnormal results are displayed) Labs Reviewed  COMPREHENSIVE METABOLIC PANEL - Abnormal; Notable for the following components:      Result Value   BUN <5 (*)    Total Protein 6.4 (*)    All other components within normal limits  CBC WITH DIFFERENTIAL/PLATELET - Abnormal; Notable for the following components:   WBC 3.5 (*)    All other components within normal limits  NOVEL CORONAVIRUS, NAA (HOSPITAL ORDER, SEND-OUT TO REF LAB)   Procedures Procedures (including critical care time)  Medications Ordered in ED Medications  ondansetron (ZOFRAN-ODT) disintegrating tablet 8 mg (8 mg Oral Given 10/12/18 0735)     Initial Impression / Assessment and Plan / ED Course  I have reviewed the triage vital signs and the nursing notes.  Pertinent labs & imaging results that were available during my care of the patient were reviewed by me and considered in my medical decision making (see chart for details).        9:18 AM Patient in no distress awake, alert. We discussed all findings including reassuring labs.  Vitals remain unremarkable as well.  Cover test is pending, but without evidence for distress, hemodynamic instability, patient is appropriate to await these results as an outpatient.  Anita Padilla was evaluated in Emergency Department on 10/12/2018 for the symptoms described in the history of present illness. She was evaluated in the context of the global COVID-19 pandemic, which necessitated consideration that the patient might be at risk for infection with the SARS-CoV-2 virus that causes COVID-19. Institutional protocols and algorithms that pertain to the evaluation of patients at risk for COVID-19  are in a state of rapid change based on information released by regulatory bodies including the CDC and federal and state organizations. These policies and algorithms were followed during the patient's care in the ED.  Final Clinical Impressions(s) / ED Diagnoses  Nausea   Carmin Muskrat, MD 10/12/18 [phone]

## 2018-10-13 LAB — NOVEL CORONAVIRUS, NAA (HOSP ORDER, SEND-OUT TO REF LAB; TAT 18-24 HRS): SARS-CoV-2, NAA: NOT DETECTED

## 2019-06-07 ENCOUNTER — Emergency Department (HOSPITAL_COMMUNITY)
Admission: EM | Admit: 2019-06-07 | Discharge: 2019-06-07 | Disposition: A | Discharge status: Home / Self Care | Payer: Self-pay | Attending: Emergency Medicine | Admitting: Emergency Medicine

## 2019-06-07 ENCOUNTER — Other Ambulatory Visit: Payer: Self-pay

## 2019-06-07 ENCOUNTER — Emergency Department (HOSPITAL_COMMUNITY): Payer: Self-pay

## 2019-06-07 ENCOUNTER — Encounter (HOSPITAL_COMMUNITY): Payer: Self-pay | Admitting: Emergency Medicine

## 2019-06-07 DIAGNOSIS — Z79899 Other long term (current) drug therapy: Secondary | ICD-10-CM | POA: Insufficient documentation

## 2019-06-07 DIAGNOSIS — R0789 Other chest pain: Secondary | ICD-10-CM | POA: Insufficient documentation

## 2019-06-07 DIAGNOSIS — R519 Headache, unspecified: Secondary | ICD-10-CM | POA: Insufficient documentation

## 2019-06-07 DIAGNOSIS — R110 Nausea: Secondary | ICD-10-CM | POA: Insufficient documentation

## 2019-06-07 LAB — I-STAT BETA HCG BLOOD, ED (MC, WL, AP ONLY): I-stat hCG, quantitative: <5 m[IU]/mL (ref <5)

## 2019-06-07 LAB — BASIC METABOLIC PANEL
Anion gap: 9 (ref 5–15)
BUN: 9 mg/dL (ref 6–20)
CO2: 25 mmol/L (ref 22–32)
Calcium: 9.4 mg/dL (ref 8.9–10.3)
Chloride: 104 mmol/L (ref 98–111)
Creatinine, Ser: 0.73 mg/dL (ref 0.44–1.00)
GFR calc Af Amer: >60 mL/min (ref >60)
GFR calc non Af Amer: >60 mL/min (ref >60)
Glucose, Bld: 86 mg/dL (ref 70–99)
Potassium: 4.2 mmol/L (ref 3.5–5.1)
Sodium: 138 mmol/L (ref 135–145)

## 2019-06-07 LAB — CBC
HCT: 42.7 % (ref 36.0–46.0)
Hemoglobin: 13.6 g/dL (ref 12.0–15.0)
MCH: 28.9 pg (ref 26.0–34.0)
MCHC: 31.9 g/dL (ref 30.0–36.0)
MCV: 90.7 fL (ref 80.0–100.0)
Platelets: 292 10*3/uL (ref 150–400)
RBC: 4.71 MIL/uL (ref 3.87–5.11)
RDW: 12.8 % (ref 11.5–15.5)
WBC: 5.2 10*3/uL (ref 4.0–10.5)
nRBC: 0 % (ref 0.0–0.2)

## 2019-06-07 LAB — TROPONIN I (HIGH SENSITIVITY)
Troponin I (High Sensitivity): 3 ng/L (ref <18)
Troponin I (High Sensitivity): 4 ng/L (ref <18)

## 2019-06-07 MED ORDER — SODIUM CHLORIDE 0.9% FLUSH: 3.0000 mL | Freq: Once | INTRAVENOUS | Status: DC

## 2019-06-07 NOTE — Discharge Instructions (Addendum)
As discussed, your evaluation today has been largely reassuring.  But, it is important that you monitor your condition carefully, and do not hesitate to return to the ED if you develop new, or concerning changes in your condition. ? ?Otherwise, please follow-up with your physician for appropriate ongoing care. ? ?

## 2019-06-07 NOTE — ED Notes (Signed)
Pt denied the assistance to call her family when this RN offered to call them.

## 2019-06-07 NOTE — ED Triage Notes (Signed)
Pt states she has had intermittent CP since Friday. The pain goes into her left arm. Pt also states she is nauseous.

## 2019-06-07 NOTE — ED Provider Notes (Signed)
[facility] EMERGENCY DEPARTMENT Provider Note   CSN: [id number] Arrival date & time: 06/07/19  1226     History Chief Complaint  Patient presents with  . Chest Pain    Anita Padilla is a 44 y.o. female.  HPI    Patient presents with pain, nausea for 4 days. She notes that she is generally well, takes no medication regularly, does not smoke, drink, use drugs. 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 No notable family history of anything got hypertension. Without clear precipitant, about 4 days ago she started having left-sided chest pressure, nonradiating There is some associated nausea, and she subsequently developed headache, and twitchiness in her right hand. No clear alleviating, exacerbating factors On arrival here she does have mild headache, frontal, without associated confusion, vision change, focal weakness.  Past Medical History:  Diagnosis Date  . Dysmenorrhea   . Fibroid   . Ovarian cyst   . Thyroid nodule   . Thyroid nodule     Patient Active Problem List   Diagnosis Date Noted  . Thyroid nodule     Past Surgical History:  Procedure Laterality Date  . CERVICAL CERCLAGE       OB History    Gravida  1   Para  0   Term  0   Preterm  0   AB  1   Living  0     SAB  1   TAB      Ectopic      Multiple      Live Births              Family History  Problem Relation Age of Onset  . Hypertension Mother   . Thyroid disease Mother     Social History   Tobacco Use  . Smoking status: Never Smoker  . Smokeless tobacco: Never Used  Substance Use Topics  . Alcohol use: Yes    Comment: socially  . Drug use: Not Currently    Home Medications Prior to Admission medications   Medication Sig Start Date End Date Taking? Authorizing Provider  Multiple Vitamins-Minerals (MULTIVITAMIN WITH MINERALS) tablet Take 1 tablet by mouth daily.   Yes [provider]  ibuprofen (ADVIL,MOTRIN) 800 MG  tablet Take 1 tablet (800 mg total) by mouth every 8 (eight) hours as needed. Patient not taking: Reported on 06/07/2019 10/08/17   Megan Salon, MD  norgestimate-ethinyl estradiol ([place] 28) 0.25-35 MG-MCG tablet Take 1 tablet by mouth every evening. Patient not taking: Reported on 06/07/2019 10/08/17   Megan Salon, MD  ondansetron (ZOFRAN ODT) 4 MG disintegrating tablet Take 1 tablet (4 mg total) by mouth every 8 (eight) hours as needed for nausea or vomiting. Patient not taking: Reported on 06/07/2019 10/12/18   Carmin Muskrat, MD  promethazine (PHENERGAN) 12.5 MG tablet Take 1 tablet (12.5 mg total) by mouth every 6 (six) hours as needed for nausea or vomiting. Patient not taking: Reported on 06/07/2019 10/08/17   Megan Salon, MD    Allergies    Patient has no known allergies.  Review of Systems   Review of Systems  Constitutional:       Per HPI, otherwise negative  HENT:       Per HPI, otherwise negative  Respiratory:       Per HPI, otherwise negative  Cardiovascular:       Per HPI, otherwise negative  Gastrointestinal: Negative for vomiting.  Endocrine:  Negative aside from HPI  Genitourinary:       Neg aside from HPI   Musculoskeletal:       Per HPI, otherwise negative  Skin: Negative.   Neurological: Positive for headaches. Negative for syncope.    Physical Exam Updated Vital Signs BP (!) 129/91 (BP Location: Left Arm)   Pulse 76   Temp 98.2 F (36.8 C) (Oral)   Resp 16   LMP 05/10/2019   SpO2 100%   Physical Exam Vitals and nursing note reviewed.  Constitutional:      General: She is not in acute distress.    Appearance: She is well-developed.  HENT:     Head: Normocephalic and atraumatic.  Eyes:     Conjunctiva/sclera: Conjunctivae normal.  Cardiovascular:     Rate and Rhythm: Normal rate and regular rhythm.  Pulmonary:     Effort: Pulmonary effort is normal. No respiratory distress.     Breath sounds: Normal breath sounds. No stridor.   Abdominal:     General: There is no distension.  Skin:    General: Skin is warm and dry.  Neurological:     Mental Status: She is alert and oriented to person, place, and time.     Cranial Nerves: No cranial nerve deficit.     Motor: No weakness, tremor, atrophy, abnormal muscle tone or seizure activity.     Coordination: Coordination normal.     ED Results / Procedures / Treatments   Labs (all labs ordered are listed, but only abnormal results are displayed) Labs Reviewed  BASIC METABOLIC PANEL  CBC  I-STAT BETA HCG BLOOD, ED ([place], [facility], [facility] ONLY)  TROPONIN I (HIGH SENSITIVITY)  TROPONIN I (HIGH SENSITIVITY)    EKG EKG Interpretation  Date/Time:  Tuesday June 07 2019 12:28:30 EST Ventricular Rate:  85 PR Interval:  136 QRS Duration: 80 QT Interval:  366 QTC Calculation: 435 R Axis:   8 Text Interpretation: Normal sinus rhythm Normal ECG Confirmed by Carmin Muskrat [phone]) on 06/07/2019 4:20:50 PM   Cardiac monitor 85 sinus unremarkable  Radiology DG Chest 2 View  Result Date: 06/07/2019 CLINICAL DATA:  Chest pain EXAM: CHEST - 2 VIEW COMPARISON:  None. FINDINGS: Lungs are clear. Heart size and pulmonary vascularity are normal. No adenopathy. No pneumothorax. No bone lesions. IMPRESSION: No abnormality noted. Electronically Signed   By: Lowella Grip III M.D.   On: 06/07/2019 12:55    Procedures Procedures (including critical care time)  Medications Ordered in ED Medications  sodium chloride flush (NS) 0.9 % injection 3 mL (has no administration in time range)    ED Course  I have reviewed the triage vital signs and the nursing notes.  Pertinent labs & imaging results that were available during my care of the patient were reviewed by me and considered in my medical decision making (see chart for details).    MDM Rules/Calculators/A&P                      6:26 PM  Awake, alert, sitting on the edge of the bed, speaking clearly, hemodynamically she is  unremarkable, cardiac rhythm 78 sinus unremarkable. We discussed all findings at length, no evidence for ACS, PE, pneumonia.   This patient presented with chest pain.  The evaluation here has been reassuring, with no evidence of ongoing coronary ischemia, infection or other acute new pathology.  Vitals and labs are consistent with the reassuring physical exam.  The patient is appropriate for further  evaluation and management as an outpatient. Final Clinical Impression(s) / ED Diagnoses Final diagnoses:  Atypical chest pain     Carmin Muskrat, MD 06/07/19 1827

## 2019-10-02 ENCOUNTER — Ambulatory Visit (HOSPITAL_COMMUNITY)
Admission: EM | Admit: 2019-10-02 | Discharge: 2019-10-02 | Disposition: A | Discharge status: Home / Self Care | Payer: PRIVATE HEALTH INSURANCE | Attending: Family Medicine | Admitting: Family Medicine

## 2019-10-02 ENCOUNTER — Other Ambulatory Visit: Payer: Self-pay

## 2019-10-02 ENCOUNTER — Encounter (HOSPITAL_COMMUNITY): Payer: Self-pay

## 2019-10-02 DIAGNOSIS — Z20822 Contact with and (suspected) exposure to covid-19: Secondary | ICD-10-CM | POA: Diagnosis present

## 2019-10-02 DIAGNOSIS — J069 Acute upper respiratory infection, unspecified: Secondary | ICD-10-CM | POA: Diagnosis present

## 2019-10-02 LAB — SARS CORONAVIRUS 2 (TAT 6-24 HRS): SARS Coronavirus 2: NEGATIVE

## 2019-10-02 NOTE — ED Provider Notes (Signed)
[facility]    CSN: [id number] Arrival date & time: 10/02/19  1127      History   Chief Complaint Chief Complaint  Patient presents with  . Headache  . Cough  . Shortness of Breath    HPI Anita Padilla is a 44 y.o. female.   HPI  Patient is here for headache, cough, shortness of breath.  Fatigue.  She has had some sweats but no chills or fever.  No sore throat.  She states that headache has been severe.  Mild body aches.  No nausea or vomiting. 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  No underlying COPD or asthma.  Does not usually have shortness of breath symptoms.  Currently feels short of breath with conversation and with exertion.   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 Dysmenorrhea   . Fibroid   . Ovarian cyst   . Thyroid nodule   . Thyroid nodule     Patient Active Problem List   Diagnosis Date Noted  . Thyroid nodule     Past Surgical History:  Procedure Laterality Date  . CERVICAL CERCLAGE      OB History    Gravida  1   Para  0   Term  0   Preterm  0   AB  1   Living  0     SAB  1   TAB      Ectopic      Multiple      Live Births               Home Medications    Prior to Admission medications   Medication Sig Start Date End Date Taking? Authorizing Provider  acetaminophen (TYLENOL) 500 MG tablet Take 500 mg by mouth every 6 (six) hours as needed.   Yes [provider]  citalopram (CELEXA) 40 MG tablet Take 40 mg by mouth daily. 08/30/19   [provider]  Multiple Vitamins-Minerals (MULTIVITAMIN WITH MINERALS) tablet Take 1 tablet by mouth daily.    [provider]  zolpidem (AMBIEN) 10 MG tablet Take 10 mg by mouth at bedtime as needed. 07/12/19   [provider]  norgestimate-ethinyl estradiol ([place] 28)  0.25-35 MG-MCG tablet Take 1 tablet by mouth every evening. Patient not taking: Reported on 06/07/2019 10/08/17 10/02/19  Megan Salon, MD  promethazine (PHENERGAN) 12.5 MG tablet Take 1 tablet (12.5 mg total) by mouth every 6 (six) hours as needed for nausea or vomiting. Patient not taking: Reported on 06/07/2019 10/08/17 10/02/19  Megan Salon, MD    Family History Family History  Problem Relation Age of Onset  . Hypertension Mother   . Thyroid disease Mother     Social History Social History   Tobacco Use  . Smoking status: Never Smoker  . Smokeless tobacco: Never Used  Substance Use Topics  . Alcohol use: Yes    Comment: socially  . Drug use: Not Currently     Allergies   Patient has no known allergies.   Review of Systems Review of Systems  Constitutional: Positive for diaphoresis and fatigue.  HENT: Positive for congestion. Negative for sore throat.   Respiratory: Positive for cough and shortness of breath.   Gastrointestinal: Negative for nausea and vomiting.  Musculoskeletal: Positive for  myalgias.  Neurological: Positive for headaches.     Physical Exam Triage Vital Signs ED Triage Vitals  Enc Vitals Group     BP 10/02/19 1309 117/78     Pulse Rate 10/02/19 1309 70     Resp 10/02/19 1309 19     Temp 10/02/19 1309 98.6 F (37 C)     Temp Source 10/02/19 1309 Oral     SpO2 10/02/19 1309 97 %     Weight --      Height --      Head Circumference --      Peak Flow --      Pain Score 10/02/19 1311 8     Pain Loc --      Pain Edu? --      Excl. in [place]? --    No data found.  Updated Vital Signs BP 117/78 (BP Location: Right Arm)   Pulse 70   Temp 98.6 F (37 C) (Oral)   Resp 19   LMP 09/06/2019 (Exact Date)   SpO2 97%     Physical Exam Constitutional:      General: She is not in acute distress.    Appearance: She is well-developed. She is ill-appearing.     Comments: Overweight.  Feels uncomfortable.  HENT:     Head: Normocephalic and  atraumatic.     Right Ear: Tympanic membrane and ear canal normal.     Left Ear: Tympanic membrane and ear canal normal.     Nose: Nose normal.     Mouth/Throat:     Mouth: Mucous membranes are moist.     Pharynx: No posterior oropharyngeal erythema.  Eyes:     Conjunctiva/sclera: Conjunctivae normal.     Pupils: Pupils are equal, round, and reactive to light.  Cardiovascular:     Rate and Rhythm: Normal rate and regular rhythm.     Heart sounds: Normal heart sounds.  Pulmonary:     Effort: Pulmonary effort is normal. No respiratory distress.     Breath sounds: Normal breath sounds.     Comments: Lungs are clear Musculoskeletal:        General: Normal range of motion.     Cervical back: Normal range of motion.  Skin:    General: Skin is warm and dry.  Neurological:     Mental Status: She is alert.  Psychiatric:        Mood and Affect: Mood normal.        Behavior: Behavior normal.     Comments: Appears tired      UC Treatments / Results  Labs (all labs ordered are listed, but only abnormal results are displayed) Labs Reviewed  SARS CORONAVIRUS 2 (TAT 6-24 HRS)    EKG   Radiology No results found.  Procedures Procedures (including critical care time)  Medications Ordered in UC Medications - No data to display  Initial Impression / Assessment and Plan / UC Course  I have reviewed the triage vital signs and the nursing notes.  Pertinent labs & imaging results that were available during my care of the patient were reviewed by me and considered in my medical decision making (see chart for details).     Reviewed possibility of Covid.  Patient does have exposure to Covid, within her hospital but not direct patient the patient exposure.  She has not had Covid vaccinations.  These were encouraged for her.  Discussed quarantine until her test results are available.  We talked about home care  Final Clinical Impressions(s) / UC Diagnoses   Final diagnoses:  Contact  with and (suspected) exposure to covid-19  Viral upper respiratory tract infection with cough     Discharge Instructions     Go home to rest Drink plenty of fluids Take Tylenol for pain or fever You may take over-the-counter cough and cold medicines as needed You must quarantine at home until your test result is available You can check for your test result in MyChart    ED Prescriptions    None     PDMP not reviewed this encounter.   Raylene Everts, MD 10/02/19 [phone]

## 2019-10-02 NOTE — Discharge Instructions (Signed)
Go home to rest Drink plenty of fluids Take Tylenol for pain or fever You may take over-the-counter cough and cold medicines as needed You must quarantine at home until your test result is available You can check for your test result in MyChart  

## 2019-10-02 NOTE — ED Triage Notes (Signed)
Pt reports headache, nasal congestion, fatigue and shortness of breath on exertion x 2 days; cough, worse at night x 1 week. Tylenol helps with the headache.

## 2019-10-27 ENCOUNTER — Other Ambulatory Visit: Payer: Self-pay

## 2019-10-27 ENCOUNTER — Ambulatory Visit (INDEPENDENT_AMBULATORY_CARE_PROVIDER_SITE_OTHER): Payer: PRIVATE HEALTH INSURANCE | Admitting: Family Medicine

## 2019-10-27 ENCOUNTER — Encounter (INDEPENDENT_AMBULATORY_CARE_PROVIDER_SITE_OTHER): Payer: Self-pay | Admitting: Family Medicine

## 2019-10-27 VITALS — BP 116/87 | HR 84 | Temp 98.2°F | Ht 68.0 in | Wt 255.0 lb

## 2019-10-27 DIAGNOSIS — Z9189 Other specified personal risk factors, not elsewhere classified: Secondary | ICD-10-CM

## 2019-10-27 DIAGNOSIS — I10 Essential (primary) hypertension: Secondary | ICD-10-CM

## 2019-10-27 DIAGNOSIS — E7849 Other hyperlipidemia: Secondary | ICD-10-CM | POA: Diagnosis not present

## 2019-10-27 DIAGNOSIS — R0602 Shortness of breath: Secondary | ICD-10-CM

## 2019-10-27 DIAGNOSIS — F418 Other specified anxiety disorders: Secondary | ICD-10-CM

## 2019-10-27 DIAGNOSIS — Z0289 Encounter for other administrative examinations: Secondary | ICD-10-CM

## 2019-10-27 DIAGNOSIS — Z6838 Body mass index (BMI) 38.0-38.9, adult: Secondary | ICD-10-CM

## 2019-10-27 DIAGNOSIS — R5383 Other fatigue: Secondary | ICD-10-CM | POA: Diagnosis not present
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
## 2019-10-28 LAB — CBC WITH DIFFERENTIAL/PLATELET
Basophils Absolute: 0 10*3/uL (ref 0.0–0.2)
Basos: 1 %
EOS (ABSOLUTE): 0.1 10*3/uL (ref 0.0–0.4)
Eos: 2 %
Hematocrit: 41.3 % (ref 34.0–46.6)
Hemoglobin: 13.6 g/dL (ref 11.1–15.9)
Immature Grans (Abs): 0 10*3/uL (ref 0.0–0.1)
Immature Granulocytes: 0 %
Lymphocytes Absolute: 2 10*3/uL (ref 0.7–3.1)
Lymphs: 41 %
MCH: 29.5 pg (ref 26.6–33.0)
MCHC: 32.9 g/dL (ref 31.5–35.7)
MCV: 90 fL (ref 79–97)
Monocytes Absolute: 0.4 10*3/uL (ref 0.1–0.9)
Monocytes: 8 %
Neutrophils Absolute: 2.4 10*3/uL (ref 1.4–7.0)
Neutrophils: 48 %
Platelets: 322 10*3/uL (ref 150–450)
RBC: 4.61 x10E6/uL (ref 3.77–5.28)
RDW: 13.1 % (ref 11.7–15.4)
WBC: 4.9 10*3/uL (ref 3.4–10.8)

## 2019-10-28 LAB — COMPREHENSIVE METABOLIC PANEL
ALT: 15 IU/L (ref 0–32)
AST: 17 IU/L (ref 0–40)
Albumin/Globulin Ratio: 1.6 (ref 1.2–2.2)
Albumin: 4.6 g/dL (ref 3.8–4.8)
Alkaline Phosphatase: 70 IU/L (ref 48–121)
BUN/Creatinine Ratio: 10 (ref 9–23)
BUN: 9 mg/dL (ref 6–24)
Bilirubin Total: 0.4 mg/dL (ref 0.0–1.2)
CO2: 24 mmol/L (ref 20–29)
Calcium: 9.8 mg/dL (ref 8.7–10.2)
Chloride: 101 mmol/L (ref 96–106)
Creatinine, Ser: 0.86 mg/dL (ref 0.57–1.00)
GFR calc Af Amer: 97 mL/min/{1.73_m2} (ref >59)
GFR calc non Af Amer: 84 mL/min/{1.73_m2} (ref >59)
Globulin, Total: 2.8 g/dL (ref 1.5–4.5)
Glucose: 70 mg/dL (ref 65–99)
Potassium: 4.5 mmol/L (ref 3.5–5.2)
Sodium: 141 mmol/L (ref 134–144)
Total Protein: 7.4 g/dL (ref 6.0–8.5)

## 2019-10-28 LAB — VITAMIN B12: Vitamin B-12: 1351 pg/mL — ABNORMAL HIGH (ref 232–1245)

## 2019-10-28 LAB — LIPID PANEL WITH LDL/HDL RATIO
Cholesterol, Total: 199 mg/dL (ref 100–199)
HDL: 70 mg/dL (ref >39)
LDL Chol Calc (NIH): 120 mg/dL — ABNORMAL HIGH (ref 0–99)
LDL/HDL Ratio: 1.7 ratio (ref 0.0–3.2)
Triglycerides: 49 mg/dL (ref 0–149)
VLDL Cholesterol Cal: 9 mg/dL (ref 5–40)

## 2019-10-28 LAB — HEMOGLOBIN A1C
Est. average glucose Bld gHb Est-mCnc: 103 mg/dL
Hgb A1c MFr Bld: 5.2 % (ref 4.8–5.6)

## 2019-10-28 LAB — T4, FREE: Free T4: 1.12 ng/dL (ref 0.82–1.77)

## 2019-10-28 LAB — VITAMIN D 25 HYDROXY (VIT D DEFICIENCY, FRACTURES): Vit D, 25-Hydroxy: 28.1 ng/mL — ABNORMAL LOW (ref 30.0–100.0)

## 2019-10-28 LAB — INSULIN, RANDOM: INSULIN: 6.3 u[IU]/mL (ref 2.6–24.9)

## 2019-10-28 LAB — FOLATE: Folate: 19.1 ng/mL (ref >3.0)

## 2019-10-28 LAB — T3: T3, Total: 111 ng/dL (ref 71–180)

## 2019-10-28 LAB — TSH: TSH: 1.42 u[IU]/mL (ref 0.450–4.500)

## 2019-11-02 NOTE — Progress Notes (Signed)
Chief Complaint:   OBESITY Anita Padilla (MR# [id number]) is a 44 y.o. female who presents for evaluation and treatment of obesity and related comorbidities. Current BMI is Body mass index is 38.77 kg/m. Anita Padilla has been struggling with her weight for many years and has been unsuccessful in either losing weight, maintaining weight loss, or reaching her healthy weight goal.  Anita Padilla is currently in the action stage of change and ready to dedicate time achieving and maintaining a healthier weight. Anita Padilla is interested in becoming our patient and working on intensive lifestyle modifications including (but not limited to) diet and exercise for weight loss.  Anita Padilla's habits were reviewed today and are as follows: Her family eats meals together, her desired weight loss is 55lbs, she has been heavy most of her life, she started gaining weight around age 60, her heaviest weight ever was 285 pounds, she has significant food cravings issues, she is frequently drinking liquids with calories, she frequently makes poor food choices, she has problems with excessive hunger, she frequently eats larger portions than normal, she has binge eating behaviors and she struggles with emotional eating.  Depression Screen Anita Padilla's Food and Mood (modified PHQ-9) score was 17.  Depression screen PHQ 2/9 10/27/2019  Decreased Interest 3  Down, Depressed, Hopeless 3  PHQ - 2 Score 6  Altered sleeping 3  Tired, decreased energy 3  Change in appetite 3  Feeling bad or failure about yourself  0  Trouble concentrating 2  Moving slowly or fidgety/restless 0  Suicidal thoughts 0  PHQ-9 Score 17  Difficult doing work/chores Somewhat difficult   Subjective:   1. Other fatigue Anita Padilla admits to daytime somnolence and admits to waking up still tired. Patent has a history of symptoms of daytime fatigue. Anita Padilla generally gets 7 or 8 hours of sleep per night, and states that she has generally restful sleep.  Snoring is present. Apneic episodes are not present. Epworth Sleepiness Score is 9.  2. Shortness of breath on exertion Vielka notes increasing shortness of breath with exercising and seems to be worsening over time with weight gain. She notes getting out of breath sooner with activity than she used to. This has not gotten worse recently. Anita Padilla denies shortness of breath at rest or orthopnea.  3. Essential hypertension Anita Padilla is not medications, and her blood pressure is stable today. She denies chest pain.  4. Other hyperlipidemia Anita Padilla is not on medications, and she would like to try to diet control. She denies chest pain.  5. Depression with anxiety Anita Padilla is stable on medications but she notes significant emotional eating behaviors.   6. At risk for heart disease Anita Padilla is at a higher than average risk for cardiovascular disease due to obesity.   Assessment/Plan:   1. Other fatigue Cameka does feel that her weight is causing her energy to be lower than it should be. Fatigue may be related to obesity, depression or many other causes. Labs will be ordered, and in the meanwhile, Anita Padilla will focus on self care including making healthy food choices, increasing physical activity and focusing on stress reduction.  - EKG 12-Lead - Comprehensive metabolic panel - CBC with Differential/Platelet - Hemoglobin A1c - Insulin, random - Lipid Panel With LDL/HDL Ratio - VITAMIN D 25 Hydroxy (Vit-D Deficiency, Fractures) - Vitamin B12 - Folate - T3 - T4, free - TSH  2. Shortness of breath on exertion Anita Padilla does feel that she gets out of breath more easily that she used  to when she exercises. Anita Padilla's shortness of breath appears to be obesity related and exercise induced. She has agreed to work on weight loss and gradually increase exercise to treat her exercise induced shortness of breath. Will continue to monitor closely.  3. Essential hypertension Anita Padilla will start her  Category 3 plan, and will continue working on healthy weight loss and exercise to improve blood pressure control. We will watch for signs of hypotension as she continues her lifestyle modifications. We will check labs today.  - Comprehensive metabolic panel - CBC with Differential/Platelet - Lipid Panel With LDL/HDL Ratio  4. Other hyperlipidemia Cardiovascular risk and specific lipid/LDL goals reviewed. We discussed several lifestyle modifications today. Anita Padilla will start her Category 3 plan, and will continue to work on exercise and weight loss efforts. We will check labs today. Orders and follow up as documented in patient record.   - Comprehensive metabolic panel - CBC with Differential/Platelet - Lipid Panel With LDL/HDL Ratio - Folate - T3 - T4, free - TSH  5. Depression with anxiety Behavior modification techniques were discussed today to help Anita Padilla deal with her emotional/non-hunger eating behaviors. We will refer to Dr. Mallie Mussel, our Bariatric Psychologist for evaluation. Orders and follow up as documented in patient record.   6. At risk for heart disease Anita Padilla was given approximately 30 minutes of coronary artery disease prevention counseling today. She is 44 y.o. female and has risk factors for heart disease including obesity. We discussed intensive lifestyle modifications today with an emphasis on specific weight loss instructions and strategies.   Repetitive spaced learning was employed today to elicit superior memory formation and behavioral change.  7. Class 2 severe obesity with serious comorbidity and body mass index (BMI) of 38.0 to 38.9 in adult, unspecified obesity type (HCC) Anita Padilla is currently in the action stage of change and her goal is to continue with weight loss efforts. I recommend Anita Padilla begin the structured treatment plan as follows:  She has agreed to the Category 3 Plan.  Exercise goals: No exercise has been prescribed for now, while concentrate on  nutritional changes.   Behavioral modification strategies: no skipping meals and meal planning and cooking strategies.  She was informed of the importance of frequent follow-up visits to maximize her success with intensive lifestyle modifications for her multiple health conditions. She was informed we would discuss her lab results at her next visit unless there is a critical issue that needs to be addressed sooner. Anita Padilla agreed to keep her next visit at the agreed upon time to discuss these results.  Objective:   Blood pressure 116/87, pulse 84, temperature 98.2 F (36.8 C), temperature source Oral, height 5\' 8"  (1.727 m), weight 255 lb (115.7 kg), last menstrual period 10/06/2019, SpO2 98 %. Body mass index is 38.77 kg/m.  EKG: Normal sinus rhythm, rate 81 BPM.  Indirect Calorimeter completed today shows a VO2 of 317 and a REE of 2208.  Her calculated basal metabolic rate is 5465 thus her basal metabolic rate is worse than expected.  General: Cooperative, alert, well developed, in no acute distress. HEENT: Conjunctivae and lids unremarkable. Cardiovascular: Regular rhythm.  Lungs: Normal work of breathing. Neurologic: No focal deficits.   Lab Results  Component Value Date   CREATININE 0.86 10/27/2019   BUN 9 10/27/2019   NA 141 10/27/2019   K 4.5 10/27/2019   CL 101 10/27/2019   CO2 24 10/27/2019   Lab Results  Component Value Date   ALT 15 10/27/2019  AST 17 10/27/2019   ALKPHOS 70 10/27/2019   BILITOT 0.4 10/27/2019   Lab Results  Component Value Date   HGBA1C 5.2 10/27/2019   Lab Results  Component Value Date   INSULIN 6.3 10/27/2019   Lab Results  Component Value Date   TSH 1.420 10/27/2019   Lab Results  Component Value Date   CHOL 199 10/27/2019   HDL 70 10/27/2019   LDLCALC 120 (H) 10/27/2019   TRIG 49 10/27/2019   Lab Results  Component Value Date   WBC 4.9 10/27/2019   HGB 13.6 10/27/2019   HCT 41.3 10/27/2019   MCV 90 10/27/2019   PLT 322  10/27/2019   No results found for: IRON, TIBC, FERRITIN  Attestation Statements:   Reviewed by clinician on day of visit: allergies, medications, problem list, medical history, surgical history, family history, social history, and previous encounter notes.   I, Trixie Dredge, am acting as transcriptionist for Dennard Nip, MD.  I have reviewed the above documentation for accuracy and completeness, and I agree with the above. - Dennard Nip, MD

## 2019-11-09 ENCOUNTER — Telehealth (INDEPENDENT_AMBULATORY_CARE_PROVIDER_SITE_OTHER): Payer: PRIVATE HEALTH INSURANCE | Admitting: Psychology

## 2019-11-09 ENCOUNTER — Other Ambulatory Visit: Payer: Self-pay

## 2019-11-09 DIAGNOSIS — F5089 Other specified eating disorder: Secondary | ICD-10-CM | POA: Diagnosis not present

## 2019-11-10 ENCOUNTER — Other Ambulatory Visit: Payer: Self-pay

## 2019-11-10 ENCOUNTER — Encounter (INDEPENDENT_AMBULATORY_CARE_PROVIDER_SITE_OTHER): Payer: Self-pay | Admitting: Family Medicine

## 2019-11-10 ENCOUNTER — Ambulatory Visit (INDEPENDENT_AMBULATORY_CARE_PROVIDER_SITE_OTHER): Payer: 59 | Admitting: Family Medicine

## 2019-11-10 VITALS — BP 118/81 | HR 76 | Temp 98.3°F | Ht 68.0 in | Wt 259.0 lb

## 2019-11-10 DIAGNOSIS — Z9189 Other specified personal risk factors, not elsewhere classified: Secondary | ICD-10-CM | POA: Diagnosis not present

## 2019-11-10 DIAGNOSIS — F3289 Other specified depressive episodes: Secondary | ICD-10-CM | POA: Diagnosis not present

## 2019-11-10 DIAGNOSIS — E7849 Other hyperlipidemia: Secondary | ICD-10-CM

## 2019-11-10 DIAGNOSIS — E559 Vitamin D deficiency, unspecified: Secondary | ICD-10-CM

## 2019-11-10 DIAGNOSIS — Z6839 Body mass index (BMI) 39.0-39.9, adult: Secondary | ICD-10-CM

## 2019-11-10 MED ORDER — BUPROPION HCL ER (SR) 150 MG PO TB12: 150.0000 mg | ORAL_TABLET | Freq: Every day | ORAL | 0 refills | Status: DC

## 2019-11-10 MED ORDER — VITAMIN D (ERGOCALCIFEROL) 1.25 MG (50000 UNIT) PO CAPS: 50000.0000 [IU] | ORAL_CAPSULE | ORAL | 0 refills | Status: DC

## 2019-11-10 NOTE — Progress Notes (Unsigned)
Office: [phone]  /  Fax: [phone]    Date: November 24, 2019   Appointment Start Time: *** Duration: *** minutes Provider: Glennie Isle, Psy.D. Type of Session: Individual Therapy  Location of Patient: {gbptloc:23249} Location of Provider: {Location of Service:22491} Type of Contact: Telepsychological Visit via MyChart Video Visit  Session Content: Anita Padilla is a 44 y.o. female presenting via [place] Visit for a follow-up appointment to address the previously established treatment goal of increasing coping skills. Today's appointment was a telepsychological visit due to COVID-19. Anita Padilla provided verbal consent for today's telepsychological appointment and she is aware she is responsible for securing confidentiality on her end of the session. Prior to proceeding with today's appointment, Anita Padilla's physical location at the time of this appointment was obtained as well a phone number she could be reached at in the event of technical difficulties. Anita Padilla and this provider participated in today's telepsychological service.   This provider conducted a brief check-in and verbally administered the PHQ-9 and GAD-7. *** Anita Padilla was receptive to today's appointment as evidenced by openness to sharing, responsiveness to feedback, and {gbreceptiveness:23401}.  Mental Status Examination:  Appearance: {Appearance:22431} Behavior: {Behavior:22445} Mood: {gbmood:21757} Affect: {Affect:22436} Speech: {Speech:22432} Eye Contact: {Eye Contact:22433} Psychomotor Activity: {Motor Activity:22434} Gait: {gbgait:23404} Thought Process: {thought process:22448}  Thought Content/Perception: {disturbances:22451} Orientation: {Orientation:22437} Memory/Concentration: {gbcognition:22449} Insight/Judgment: {Insight:22446}  Structured Assessments Results: The Patient Health Questionnaire-9 (PHQ-9) is a self-report measure that assesses symptoms and severity of depression over the course of the last two  weeks. Anita Padilla obtained a score of *** suggesting {GBPHQ9SEVERITY:21752}. Anita Padilla finds the endorsed symptoms to be {gbphq9difficulty:21754}. [0= Not at all; 1= Several days; 2= More than half the days; 3= Nearly every day] Little interest or pleasure in doing things ***  Feeling down, depressed, or hopeless ***  Trouble falling or staying asleep, or sleeping too much ***  Feeling tired or having little energy ***  Poor appetite or overeating ***  Feeling bad about yourself --- or that you are a failure or have let yourself or your family down ***  Trouble concentrating on things, such as reading the newspaper or watching television ***  Moving or speaking so slowly that other people could have noticed? Or the opposite --- being so fidgety or restless that you have been moving around a lot more than usual ***  Thoughts that you would be better off dead or hurting yourself in some way ***  PHQ-9 Score ***    The Generalized Anxiety Disorder-7 (GAD-7) is a brief self-report measure that assesses symptoms of anxiety over the course of the last two weeks. Anita Padilla obtained a score of *** suggesting {gbgad7severity:21753}. Anita Padilla finds the endorsed symptoms to be {gbphq9difficulty:21754}. [0= Not at all; 1= Several days; 2= Over half the days; 3= Nearly every day] Feeling nervous, anxious, on edge ***  Not being able to stop or control worrying ***  Worrying too much about different things ***  Trouble relaxing ***  Being so restless that it's hard to sit still ***  Becoming easily annoyed or irritable ***  Feeling afraid as if something awful might happen ***  GAD-7 Score ***   Interventions:  {Interventions for Progress Notes:23405}  DSM-5 Diagnosis(es): 307.59 (F50.8) Other Specified Feeding or Eating Disorder, Emotional Eating Behaviors  Treatment Goal & Progress: During the initial appointment with this provider, the following treatment goal was established: increase coping skills.  Anita Padilla has demonstrated progress in her goal as evidenced by {gbtxprogress:22839}. Anita Padilla also {gbtxprogress2:22951}.  Plan: The next appointment will  be scheduled in {gbweeks:21758}, which will be {gbtxmodality:23402}. The next session will focus on {Plan for Next Appointment:23400}.

## 2019-11-14 NOTE — Progress Notes (Signed)
Chief Complaint:   OBESITY Anita Padilla is here to discuss her progress with her obesity treatment plan along with follow-up of her obesity related diagnoses. Anita Padilla is on the Category 3 Plan and states she is following her eating plan approximately 50% of the time. Anita Padilla states she is walking for 30 minutes 3-4 times per week.  Today's visit was #: 2 Starting weight: 255 lbs Starting date: 10/27/2019 Today's weight: 259 lbs Today's date: 11/10/2019 Total lbs lost to date: 0 Total lbs lost since last in-office visit: 0  Interim History: Anita Padilla felt she was doing well on her plan initially, but then she had significant PMS cravings and got off track.  Subjective:   1. Vitamin D deficiency Anita Padilla is not on Vit D, and she notes fatigue. I discussed labs with the patient today.  2. Other hyperlipidemia Anita Padilla's LDL is elevated. She is not on statin, and she is working on diet and exercise. I discussed labs with the patient today.  3. Other depression with emotional eating Anita Padilla notes significant stress eating, especially related to PMS symptoms.  4. At risk for heart disease Anita Padilla is at a higher than average risk for cardiovascular disease due to obesity.   Assessment/Plan:   1. Vitamin D deficiency Low Vitamin D level contributes to fatigue and are associated with obesity, breast, and colon cancer. Anita Padilla agreed to start prescription Vitamin D 50,000 IU every week with no refills. She will follow-up for routine testing of Vitamin D, at least 2-3 times per year to avoid over-replacement.  - Vitamin D, Ergocalciferol, (DRISDOL) 1.25 MG (50000 UNIT) CAPS capsule; Take 1 capsule (50,000 Units total) by mouth every 7 (seven) days.  Dispense: 4 capsule; Refill: 0  2. Other hyperlipidemia Cardiovascular risk and specific lipid/LDL goals reviewed. We discussed several lifestyle modifications today and [name] will continue to work on diet, exercise and weight loss efforts. We  will recheck labs in 3 months. Orders and follow up as documented in patient record.   3. Other depression with emotional eating Behavior modification techniques were discussed today to help Anita Padilla deal with her emotional/non-hunger eating behaviors. Anita Padilla agreed to start Wellbutrin SR 150 mg q AM with no refills, and will continue Celexa as is. Orders and follow up as documented in patient record.   - buPROPion (WELLBUTRIN SR) 150 MG 12 hr tablet; Take 1 tablet (150 mg total) by mouth daily.  Dispense: 30 tablet; Refill: 0  4. At risk for heart disease Anita Padilla was given approximately 30 minutes of coronary artery disease prevention counseling today. She is 43 y.o. female and has risk factors for heart disease including obesity. We discussed intensive lifestyle modifications today with an emphasis on specific weight loss instructions and strategies.   Repetitive spaced learning was employed today to elicit superior memory formation and behavioral change.  5. Class 2 severe obesity with serious comorbidity and body mass index (BMI) of 39.0 to 39.9 in adult, unspecified obesity type (HCC) Anita Padilla is currently in the action stage of change. As such, her goal is to continue with weight loss efforts. She has agreed to the Category 3 Plan.   Exercise goals: As is.  Behavioral modification strategies: increasing lean protein intake and emotional eating strategies.  Anita Padilla has agreed to follow-up with our clinic in 2 weeks. She was informed of the importance of frequent follow-up visits to maximize her success with intensive lifestyle modifications for her multiple health conditions.   Objective:   Blood pressure 118/81, pulse  76, temperature 98.3 F (36.8 C), temperature source Oral, height 5\' 8"  (1.727 m), weight 259 lb (117.5 kg), last menstrual period 11/05/2019, SpO2 99 %. Body mass index is 39.38 kg/m.  General: Cooperative, alert, well developed, in no acute distress. HEENT:  Conjunctivae and lids unremarkable. Cardiovascular: Regular rhythm.  Lungs: Normal work of breathing. Neurologic: No focal deficits.   Lab Results  Component Value Date   CREATININE 0.86 10/27/2019   BUN 9 10/27/2019   NA 141 10/27/2019   K 4.5 10/27/2019   CL 101 10/27/2019   CO2 24 10/27/2019   Lab Results  Component Value Date   ALT 15 10/27/2019   AST 17 10/27/2019   ALKPHOS 70 10/27/2019   BILITOT 0.4 10/27/2019   Lab Results  Component Value Date   HGBA1C 5.2 10/27/2019   Lab Results  Component Value Date   INSULIN 6.3 10/27/2019   Lab Results  Component Value Date   TSH 1.420 10/27/2019   Lab Results  Component Value Date   CHOL 199 10/27/2019   HDL 70 10/27/2019   LDLCALC 120 (H) 10/27/2019   TRIG 49 10/27/2019   Lab Results  Component Value Date   WBC 4.9 10/27/2019   HGB 13.6 10/27/2019   HCT 41.3 10/27/2019   MCV 90 10/27/2019   PLT 322 10/27/2019   No results found for: IRON, TIBC, FERRITIN  Attestation Statements:   Reviewed by clinician on day of visit: allergies, medications, problem list, medical history, surgical history, family history, social history, and previous encounter notes.   I, Trixie Dredge, am acting as transcriptionist for Dennard Nip, MD.  I have reviewed the above documentation for accuracy and completeness, and I agree with the above. -  Dennard Nip, MD

## 2019-11-24 ENCOUNTER — Telehealth (INDEPENDENT_AMBULATORY_CARE_PROVIDER_SITE_OTHER): Payer: PRIVATE HEALTH INSURANCE | Admitting: Psychology

## 2019-11-30 ENCOUNTER — Ambulatory Visit (INDEPENDENT_AMBULATORY_CARE_PROVIDER_SITE_OTHER): Payer: PRIVATE HEALTH INSURANCE | Admitting: Family Medicine

## 2019-12-01 ENCOUNTER — Encounter (INDEPENDENT_AMBULATORY_CARE_PROVIDER_SITE_OTHER): Payer: Self-pay

## 2019-12-01 ENCOUNTER — Ambulatory Visit (INDEPENDENT_AMBULATORY_CARE_PROVIDER_SITE_OTHER): Payer: PRIVATE HEALTH INSURANCE | Admitting: Bariatrics

## 2019-12-01 ENCOUNTER — Other Ambulatory Visit (INDEPENDENT_AMBULATORY_CARE_PROVIDER_SITE_OTHER): Payer: Self-pay | Admitting: Family Medicine

## 2019-12-01 DIAGNOSIS — E559 Vitamin D deficiency, unspecified: Secondary | ICD-10-CM

## 2019-12-03 ENCOUNTER — Other Ambulatory Visit (INDEPENDENT_AMBULATORY_CARE_PROVIDER_SITE_OTHER): Payer: Self-pay | Admitting: Family Medicine

## 2019-12-03 DIAGNOSIS — F3289 Other specified depressive episodes: Secondary | ICD-10-CM

## 2019-12-10 ENCOUNTER — Other Ambulatory Visit (INDEPENDENT_AMBULATORY_CARE_PROVIDER_SITE_OTHER): Payer: Self-pay | Admitting: Family Medicine

## 2019-12-10 DIAGNOSIS — F3289 Other specified depressive episodes: Secondary | ICD-10-CM

## 2019-12-21 ENCOUNTER — Encounter (HOSPITAL_COMMUNITY): Payer: Self-pay

## 2019-12-21 ENCOUNTER — Ambulatory Visit (HOSPITAL_COMMUNITY)
Admission: EM | Admit: 2019-12-21 | Discharge: 2019-12-21 | Disposition: A | Discharge status: Home / Self Care | Payer: PRIVATE HEALTH INSURANCE | Attending: Emergency Medicine | Admitting: Emergency Medicine

## 2019-12-21 ENCOUNTER — Other Ambulatory Visit: Payer: Self-pay

## 2019-12-21 DIAGNOSIS — F329 Major depressive disorder, single episode, unspecified: Secondary | ICD-10-CM | POA: Insufficient documentation

## 2019-12-21 DIAGNOSIS — R519 Headache, unspecified: Secondary | ICD-10-CM | POA: Insufficient documentation

## 2019-12-21 DIAGNOSIS — U071 COVID-19: Secondary | ICD-10-CM | POA: Diagnosis not present

## 2019-12-21 DIAGNOSIS — Z79899 Other long term (current) drug therapy: Secondary | ICD-10-CM | POA: Insufficient documentation

## 2019-12-21 DIAGNOSIS — J111 Influenza due to unidentified influenza virus with other respiratory manifestations: Secondary | ICD-10-CM

## 2019-12-21 DIAGNOSIS — F17200 Nicotine dependence, unspecified, uncomplicated: Secondary | ICD-10-CM | POA: Insufficient documentation

## 2019-12-21 DIAGNOSIS — R509 Fever, unspecified: Secondary | ICD-10-CM | POA: Diagnosis present

## 2019-12-21 LAB — SARS CORONAVIRUS 2 (TAT 6-24 HRS): SARS Coronavirus 2: POSITIVE — AB

## 2019-12-21 NOTE — ED Provider Notes (Signed)
[facility]    CSN: [id number] Arrival date & time: 12/21/19  1257      History   Chief Complaint Chief Complaint  Patient presents with  . Fever    HPI Anita Padilla is a 44 y.o. female.   Anita Padilla presents with complaints of cough, fevers, body aches. Cough started 8/22. Noted temp of 100 on 8/23. Yesterday morning woke with body aches and weakness. Temp yesterday of 102.1. Headache. Decreased appetite. Has been alternating tylenol and motrin. This morning, temp again of 102 and felt the same, body aches. Skin is chilled and achy to touch. Productive cough. Took tylenol before she came. No gi symptoms. No sore throat, no ear pain. Headache feels improved once temperature goes down. No better, no worse today. Shortness of breath with talking a lot or with increased activity. No shortness of breath while at rest. Denies any previous similar. Chest pain related to coughing. No known ill contacts. 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 A few coworkers also with illness, concerning for covid. No history of covid-19 and has not received vaccination. Taste has been off. No loss of sense of smell.    ROS per HPI, negative if not otherwise mentioned.      Past Medical History:  Diagnosis Date  . Anxiety   . Back pain   . Borderline hypertension   . Depression   . Dysmenorrhea   . Fibroid    uterine  . Hyperlipidemia   . Joint pain   . Lower extremity edema   . Ovarian cyst   . Seasonal allergies   . Thyroid nodule   . Thyroid nodule     Patient Active Problem List   Diagnosis Date Noted  . Thyroid nodule     Past Surgical History:  Procedure Laterality Date  . CERVICAL CERCLAGE      OB History    Gravida  1   Para  0   Term  0   Preterm  0   AB  1   Living  0     SAB  1   TAB      Ectopic      Multiple      Live Births               Home Medications    Prior to Admission  medications   Medication Sig Start Date End Date Taking? Authorizing Provider  citalopram (CELEXA) 40 MG tablet Take 40 mg by mouth daily. 08/30/19   [provider]  fexofenadine (ALLEGRA) 180 MG tablet Take 180 mg by mouth daily.    [provider]  Multiple Vitamins-Minerals (MULTIVITAMIN WITH MINERALS) tablet Take 1 tablet by mouth daily.    [provider]  zolpidem (AMBIEN) 10 MG tablet Take 10 mg by mouth at bedtime as needed. 07/12/19   [provider]  norgestimate-ethinyl estradiol ([place] 28) 0.25-35 MG-MCG tablet Take 1 tablet by mouth every evening. Patient not taking: Reported on 06/07/2019 10/08/17 10/02/19  Megan Salon, MD  promethazine (PHENERGAN) 12.5 MG tablet Take 1 tablet (12.5 mg total) by mouth every 6 (six) hours as needed for nausea or vomiting. Patient not taking: Reported on 06/07/2019 10/08/17 10/02/19  Megan Salon, MD    Family History Family History  Problem Relation Age of Onset  . Hypertension Mother   . Thyroid disease Mother   . Depression Mother   .  Anxiety disorder Mother     Social History Social History   Tobacco Use  . Smoking status: Never Smoker  . Smokeless tobacco: Never Used  Vaping Use  . Vaping Use: Never used  Substance Use Topics  . Alcohol use: Yes    Comment: socially  . Drug use: Not Currently     Allergies   Patient has no known allergies.   Review of Systems Review of Systems   Physical Exam Triage Vital Signs ED Triage Vitals  Enc Vitals Group     BP 12/21/19 1436 (!) 163/98     Pulse Rate 12/21/19 1436 100     Resp 12/21/19 1436 20     Temp 12/21/19 1436 98.9 F (37.2 C)     Temp src --      SpO2 12/21/19 1436 100 %     Weight --      Height --      Head Circumference --      Peak Flow --      Pain Score 12/21/19 1437 0     Pain Loc --      Pain Edu? --      Excl. in [place]? --    No data found.  Updated Vital Signs BP (!) 163/98   Pulse 100   Temp 98.9 F (37.2 C)    Resp 20   SpO2 100%   Visual Acuity Right Eye Distance:   Left Eye Distance:   Bilateral Distance:    Right Eye Near:   Left Eye Near:    Bilateral Near:     Physical Exam Constitutional:      General: She is not in acute distress.    Appearance: She is well-developed.  Cardiovascular:     Rate and Rhythm: Normal rate.  Pulmonary:     Effort: Pulmonary effort is normal.  Skin:    General: Skin is warm and dry.  Neurological:     Mental Status: She is alert and oriented to person, place, and time.      UC Treatments / Results  Labs (all labs ordered are listed, but only abnormal results are displayed) Labs Reviewed  SARS CORONAVIRUS 2 (TAT 6-24 HRS)    EKG   Radiology No results found.  Procedures Procedures (including critical care time)  Medications Ordered in UC Medications - No data to display  Initial Impression / Assessment and Plan / UC Course  I have reviewed the triage vital signs and the nursing notes.  Pertinent labs & imaging results that were available during my care of the patient were reviewed by me and considered in my medical decision making (see chart for details).     Non toxic. Benign physical exam.  No work of breathing. Vitals stable. covid testing collected and pending. Supportive cares recommended. Return precautions provided. Patient verbalized understanding and agreeable to plan.   Final Clinical Impressions(s) / UC Diagnoses   Final diagnoses:  Influenza-like illness     Discharge Instructions     Push fluids to ensure adequate hydration and keep secretions thin.  Tylenol and/or ibuprofen as needed for pain or fevers.  Over the counter  medications as needed for symptoms.  You may try some vitamins to help your immune system potentially:  Vitamin C 500mg  twice a day. Zinc 50mg  daily. Vitamin D 5000IU daily.   Self isolate until covid results are back and negative.  Will notify you by phone of any positive findings.  Your negative results  will be sent through your MyChart.     If symptoms worsen or do not improve in the next week to return to be seen or to follow up with your PCP.      ED Prescriptions    None     PDMP not reviewed this encounter.   Zigmund Gottron, NP 12/21/19 1626

## 2019-12-21 NOTE — Discharge Instructions (Signed)
Push fluids to ensure adequate hydration and keep secretions thin.  Tylenol and/or ibuprofen as needed for pain or fevers.  Over the counter  medications as needed for symptoms.  You may try some vitamins to help your immune system potentially:  Vitamin C 500mg  twice a day. Zinc 50mg  daily. Vitamin D 5000IU daily.   Self isolate until covid results are back and negative.  Will notify you by phone of any positive findings. Your negative results will be sent through your MyChart.     If symptoms worsen or do not improve in the next week to return to be seen or to follow up with your PCP.

## 2019-12-21 NOTE — ED Notes (Signed)
Patient able to ambulate independently  

## 2019-12-21 NOTE — ED Triage Notes (Signed)
Pt reports fever of 102 at home x 3 days. Taking Tylenol and motrin. Has body aches and coughing as well. 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

## 2019-12-22 ENCOUNTER — Other Ambulatory Visit: Payer: Self-pay

## 2019-12-22 ENCOUNTER — Emergency Department (HOSPITAL_COMMUNITY)
Admission: EM | Admit: 2019-12-22 | Discharge: 2019-12-22 | Disposition: A | Discharge status: Home / Self Care | Payer: PRIVATE HEALTH INSURANCE | Attending: Emergency Medicine | Admitting: Emergency Medicine

## 2019-12-22 ENCOUNTER — Encounter (HOSPITAL_COMMUNITY): Payer: Self-pay | Admitting: Emergency Medicine

## 2019-12-22 DIAGNOSIS — Z5321 Procedure and treatment not carried out due to patient leaving prior to being seen by health care provider: Secondary | ICD-10-CM | POA: Insufficient documentation

## 2019-12-22 DIAGNOSIS — R509 Fever, unspecified: Secondary | ICD-10-CM | POA: Diagnosis present

## 2019-12-22 DIAGNOSIS — U071 COVID-19: Secondary | ICD-10-CM | POA: Insufficient documentation

## 2019-12-22 NOTE — ED Notes (Signed)
Unable to locate in waiting room

## 2019-12-22 NOTE — ED Triage Notes (Signed)
Pt reports fevers at home x 3 days, states she has been alternating motrin and tylenol. Covid tested yesterday, +result.

## 2019-12-25 ENCOUNTER — Other Ambulatory Visit: Payer: Self-pay

## 2019-12-25 ENCOUNTER — Encounter (HOSPITAL_COMMUNITY): Payer: Self-pay | Admitting: Emergency Medicine

## 2019-12-25 ENCOUNTER — Emergency Department (HOSPITAL_COMMUNITY)
Admission: EM | Admit: 2019-12-25 | Discharge: 2019-12-25 | Disposition: A | Discharge status: Home / Self Care | Payer: PRIVATE HEALTH INSURANCE | Attending: Emergency Medicine | Admitting: Emergency Medicine

## 2019-12-25 ENCOUNTER — Emergency Department (HOSPITAL_COMMUNITY): Payer: PRIVATE HEALTH INSURANCE

## 2019-12-25 DIAGNOSIS — R197 Diarrhea, unspecified: Secondary | ICD-10-CM | POA: Diagnosis present

## 2019-12-25 DIAGNOSIS — E860 Dehydration: Secondary | ICD-10-CM | POA: Diagnosis not present

## 2019-12-25 DIAGNOSIS — R112 Nausea with vomiting, unspecified: Secondary | ICD-10-CM

## 2019-12-25 DIAGNOSIS — U071 COVID-19: Secondary | ICD-10-CM | POA: Diagnosis not present

## 2019-12-25 DIAGNOSIS — R509 Fever, unspecified: Secondary | ICD-10-CM

## 2019-12-25 DIAGNOSIS — I10 Essential (primary) hypertension: Secondary | ICD-10-CM | POA: Insufficient documentation

## 2019-12-25 DIAGNOSIS — Z79899 Other long term (current) drug therapy: Secondary | ICD-10-CM | POA: Diagnosis not present

## 2019-12-25 LAB — COMPREHENSIVE METABOLIC PANEL
ALT: 28 U/L (ref 0–44)
AST: 73 U/L — ABNORMAL HIGH (ref 15–41)
Albumin: 3.9 g/dL (ref 3.5–5.0)
Alkaline Phosphatase: 54 U/L (ref 38–126)
Anion gap: 15 (ref 5–15)
BUN: 8 mg/dL (ref 6–20)
CO2: 21 mmol/L — ABNORMAL LOW (ref 22–32)
Calcium: 8.7 mg/dL — ABNORMAL LOW (ref 8.9–10.3)
Chloride: 96 mmol/L — ABNORMAL LOW (ref 98–111)
Creatinine, Ser: 1.07 mg/dL — ABNORMAL HIGH (ref 0.44–1.00)
GFR calc Af Amer: >60 mL/min (ref >60)
GFR calc non Af Amer: >60 mL/min (ref >60)
Glucose, Bld: 131 mg/dL — ABNORMAL HIGH (ref 70–99)
Potassium: 4.9 mmol/L (ref 3.5–5.1)
Sodium: 132 mmol/L — ABNORMAL LOW (ref 135–145)
Total Bilirubin: 1.4 mg/dL — ABNORMAL HIGH (ref 0.3–1.2)
Total Protein: 8.3 g/dL — ABNORMAL HIGH (ref 6.5–8.1)

## 2019-12-25 LAB — CBC WITH DIFFERENTIAL/PLATELET
Abs Immature Granulocytes: 0.04 10*3/uL (ref 0.00–0.07)
Basophils Absolute: 0 10*3/uL (ref 0.0–0.1)
Basophils Relative: 0 %
Eosinophils Absolute: 0 10*3/uL (ref 0.0–0.5)
Eosinophils Relative: 0 %
HCT: 42.9 % (ref 36.0–46.0)
Hemoglobin: 14 g/dL (ref 12.0–15.0)
Immature Granulocytes: 1 %
Lymphocytes Relative: 16 %
Lymphs Abs: 0.8 10*3/uL (ref 0.7–4.0)
MCH: 29 pg (ref 26.0–34.0)
MCHC: 32.6 g/dL (ref 30.0–36.0)
MCV: 88.8 fL (ref 80.0–100.0)
Monocytes Absolute: 0.2 10*3/uL (ref 0.1–1.0)
Monocytes Relative: 4 %
Neutro Abs: 4 10*3/uL (ref 1.7–7.7)
Neutrophils Relative %: 79 %
Platelets: 205 10*3/uL (ref 150–400)
RBC: 4.83 MIL/uL (ref 3.87–5.11)
RDW: 13.6 % (ref 11.5–15.5)
WBC: 5 10*3/uL (ref 4.0–10.5)
nRBC: 0 % (ref 0.0–0.2)

## 2019-12-25 LAB — I-STAT BETA HCG BLOOD, ED (MC, WL, AP ONLY): I-stat hCG, quantitative: <5 m[IU]/mL (ref <5)

## 2019-12-25 LAB — LIPASE, BLOOD: Lipase: 27 U/L (ref 11–51)

## 2019-12-25 MED ORDER — KETOROLAC TROMETHAMINE 15 MG/ML IJ SOLN
15.0000 mg | Freq: Once | INTRAMUSCULAR | Status: AC
  Administered 2019-12-25: 15 mg via INTRAVENOUS
  Filled 2019-12-25: qty 1

## 2019-12-25 MED ORDER — METHOCARBAMOL 500 MG PO TABS: 500.0000 mg | ORAL_TABLET | Freq: Two times a day (BID) | ORAL | 0 refills | Status: DC

## 2019-12-25 MED ORDER — SODIUM CHLORIDE 0.9 % IV BOLUS
1000.0000 mL | Freq: Once | INTRAVENOUS | Status: AC
  Administered 2019-12-25: 1000 mL via INTRAVENOUS

## 2019-12-25 MED ORDER — PROMETHAZINE-DM 6.25-15 MG/5ML PO SYRP: 5.0000 mL | ORAL_SOLUTION | Freq: Four times a day (QID) | ORAL | 0 refills | Status: DC | PRN

## 2019-12-25 MED ORDER — ONDANSETRON 4 MG PO TBDP
4.0000 mg | ORAL_TABLET | Freq: Once | ORAL | Status: AC
  Administered 2019-12-25: 4 mg via ORAL
  Filled 2019-12-25: qty 1

## 2019-12-25 MED ORDER — SODIUM CHLORIDE 0.9 % IV BOLUS: 500.0000 mL | Freq: Once | INTRAVENOUS | Status: DC

## 2019-12-25 MED ORDER — ACETAMINOPHEN 650 MG RE SUPP
650.0000 mg | Freq: Once | RECTAL | Status: AC
  Administered 2019-12-25: 650 mg via RECTAL
  Filled 2019-12-25: qty 1

## 2019-12-25 MED ORDER — ONDANSETRON 4 MG PO TBDP: 4.0000 mg | ORAL_TABLET | Freq: Three times a day (TID) | ORAL | 0 refills | Status: DC | PRN

## 2019-12-25 MED ORDER — ACETAMINOPHEN 500 MG PO TABS
1000.0000 mg | ORAL_TABLET | Freq: Once | ORAL | Status: AC
  Administered 2019-12-25: 1000 mg via ORAL
  Filled 2019-12-25: qty 2

## 2019-12-25 MED ORDER — ONDANSETRON HCL 4 MG/2ML IJ SOLN
4.0000 mg | Freq: Once | INTRAMUSCULAR | Status: AC
  Administered 2019-12-25: 4 mg via INTRAVENOUS
  Filled 2019-12-25: qty 2

## 2019-12-25 MED ORDER — LORAZEPAM 2 MG/ML IJ SOLN
0.5000 mg | Freq: Once | INTRAMUSCULAR | Status: AC
  Administered 2019-12-25: 0.5 mg via INTRAVENOUS
  Filled 2019-12-25: qty 1

## 2019-12-25 MED ORDER — BENZONATATE 100 MG PO CAPS: 100.0000 mg | ORAL_CAPSULE | Freq: Three times a day (TID) | ORAL | 0 refills | Status: DC

## 2019-12-25 NOTE — ED Notes (Signed)
[facility] called for pt. Explained to pt that they are willing to take pt home, but unsure if insurance will cover the cost of transport. PT stilled agreeable with [facility].

## 2019-12-25 NOTE — ED Triage Notes (Signed)
Per [facility] pt from home for fever, body aches, n/v/d. Covid+ 4 days ago.  100.9 temp, 97% on RA

## 2019-12-25 NOTE — ED Notes (Signed)
Anita Padilla ambulated patient in room with pulse ox. Pt oxygen stats did not go below 90%. Gait steady and no assistance.

## 2019-12-25 NOTE — ED Provider Notes (Signed)
[facility] DEPT Provider Note   CSN: [id number] Arrival date & time: 12/25/19  1426     History Chief Complaint  Patient presents with  . Covid positive  . Fever  . Generalized Body Aches  . Emesis  . Diarrhea    Anita Padilla is a 44 y.o. female.  HPI  Patient is 44 year old female with hx of obesity, HLD, anxiet, fibroid uterus, depression.  Patient is presenting today with 7 days of NVD fever, chills, body aches and cough/congestion. Denies any SOB. States she is unsure how many episodes of diarrhea she's had but > 5 per day. She states her aches are generalized. Denies abd pain, chest pain or SOB.   Only 2 episodes of vomiting that was NBNB.  States she tested + for covid 4 days ago. She has had consistent sx since then.   She denies any light headedness, syncope or near syncope but states she just feels too fatigued and came to ED to be evaluated.       Past Medical History:  Diagnosis Date  . Anxiety   . Back pain   . Borderline hypertension   . Depression   . Dysmenorrhea   . Fibroid    uterine  . Hyperlipidemia   . Joint pain   . Lower extremity edema   . Ovarian cyst   . Seasonal allergies   . Thyroid nodule   . Thyroid nodule     Patient Active Problem List   Diagnosis Date Noted  . Thyroid nodule     Past Surgical History:  Procedure Laterality Date  . CERVICAL CERCLAGE       OB History    Gravida  1   Para  0   Term  0   Preterm  0   AB  1   Living  0     SAB  1   TAB      Ectopic      Multiple      Live Births              Family History  Problem Relation Age of Onset  . Hypertension Mother   . Thyroid disease Mother   . Depression Mother   . Anxiety disorder Mother     Social History   Tobacco Use  . Smoking status: Never Smoker  . Smokeless tobacco: Never Used  Vaping Use  . Vaping Use: Never used  Substance Use Topics  . Alcohol use: Yes    Comment: socially  .  Drug use: Not Currently    Home Medications Prior to Admission medications   Medication Sig Start Date End Date Taking? Authorizing Provider  benzonatate (TESSALON) 100 MG capsule Take 1 capsule (100 mg total) by mouth every 8 (eight) hours. 12/25/19   Tedd Sias, PA  citalopram (CELEXA) 40 MG tablet Take 40 mg by mouth daily. 08/30/19   [provider]  fexofenadine (ALLEGRA) 180 MG tablet Take 180 mg by mouth daily.    [provider]  methocarbamol (ROBAXIN) 500 MG tablet Take 1 tablet (500 mg total) by mouth 2 (two) times daily. 12/25/19   Tedd Sias, PA  Multiple Vitamins-Minerals (MULTIVITAMIN WITH MINERALS) tablet Take 1 tablet by mouth daily.    [provider]  ondansetron (ZOFRAN ODT) 4 MG disintegrating tablet Take 1 tablet (4 mg total) by mouth every 8 (eight) hours as needed for nausea or vomiting. 12/25/19   Pati Gallo  S, PA  promethazine-dextromethorphan (PROMETHAZINE-DM) 6.25-15 MG/5ML syrup Take 5 mLs by mouth 4 (four) times daily as needed for cough. 12/25/19   Tedd Sias, PA  zolpidem (AMBIEN) 10 MG tablet Take 10 mg by mouth at bedtime as needed. 07/12/19   [provider]  norgestimate-ethinyl estradiol ([place] 28) 0.25-35 MG-MCG tablet Take 1 tablet by mouth every evening. Patient not taking: Reported on 06/07/2019 10/08/17 10/02/19  Megan Salon, MD  promethazine (PHENERGAN) 12.5 MG tablet Take 1 tablet (12.5 mg total) by mouth every 6 (six) hours as needed for nausea or vomiting. Patient not taking: Reported on 06/07/2019 10/08/17 10/02/19  Megan Salon, MD    Allergies    Patient has no known allergies.  Review of Systems   Review of Systems  Constitutional: Positive for appetite change, chills and fatigue. Negative for fever.  HENT: Positive for congestion.   Eyes: Negative for pain.  Respiratory: Positive for cough. Negative for shortness of breath.   Cardiovascular: Negative for chest pain and leg swelling.    Gastrointestinal: Positive for diarrhea, nausea and vomiting. Negative for abdominal pain and constipation.  Genitourinary: Negative for dysuria.  Musculoskeletal: Positive for myalgias.  Skin: Negative for rash.  Neurological: Negative for dizziness and headaches.    Physical Exam Updated Vital Signs BP (!) 136/95   Pulse (!) 107   Temp 99.3 F (37.4 C) (Oral)   Resp (!) 26   Ht 5\' 8"  (1.727 m)   Wt 113.4 kg   LMP 12/04/2019   SpO2 90%   BMI 38.01 kg/m   Physical Exam Vitals and nursing note reviewed.  Constitutional:      General: She is in acute distress.     Appearance: She is obese.     Comments: Patient is actively dry heaving as I enter the room.  She does recover from this and is able answer questions appropriate follow commands.  HENT:     Head: Normocephalic and atraumatic.     Nose: Nose normal.     Mouth/Throat:     Mouth: Mucous membranes are dry.  Eyes:     General: No scleral icterus. Cardiovascular:     Rate and Rhythm: Regular rhythm. Tachycardia present.     Pulses: Normal pulses.     Heart sounds: Normal heart sounds.     Comments: Heart rate 130 Pulmonary:     Effort: Pulmonary effort is normal. No respiratory distress.     Breath sounds: Normal breath sounds. No wheezing.     Comments: Mild tachypnea with respiratory rate of 24.  Lungs are clear to auscultation all fields.  Speaking in full sentences. Abdominal:     Palpations: Abdomen is soft.     Tenderness: There is no abdominal tenderness. There is no guarding or rebound.     Comments: Soft non tender abd iwht no guarding rebound or CVA TTP. Skin warm to touch.  Musculoskeletal:     Cervical back: Normal range of motion.     Right lower leg: No edema.     Left lower leg: No edema.  Skin:    General: Skin is warm and dry.     Capillary Refill: Capillary refill takes less than 2 seconds.  Neurological:     Mental Status: She is alert. Mental status is at baseline.  Psychiatric:         Mood and Affect: Mood normal.        Behavior: Behavior normal.     ED  Results / Procedures / Treatments   Labs (all labs ordered are listed, but only abnormal results are displayed) Labs Reviewed  COMPREHENSIVE METABOLIC PANEL - Abnormal; Notable for the following components:      Result Value   Sodium 132 (*)    Chloride 96 (*)    CO2 21 (*)    Glucose, Bld 131 (*)    Creatinine, Ser 1.07 (*)    Calcium 8.7 (*)    Total Protein 8.3 (*)    AST 73 (*)    Total Bilirubin 1.4 (*)    All other components within normal limits  CBC WITH DIFFERENTIAL/PLATELET  LIPASE, BLOOD  I-STAT BETA HCG BLOOD, ED ([facility], [facility], [facility] ONLY)    EKG None  Radiology DG Chest Portable 1 View  Result Date: 12/25/2019 CLINICAL DATA:  COVID positive with body aches and fever. EXAM: PORTABLE CHEST 1 VIEW COMPARISON:  June 07, 2019 FINDINGS: Mild hazy infiltrates are seen within the mid left lung. There is no evidence of a pleural effusion or pneumothorax. The heart size and mediastinal contours are within normal limits. The visualized skeletal structures are unremarkable. IMPRESSION: Mild hazy mid left lung infiltrates. Electronically Signed   By: Virgina Norfolk M.D.   On: 12/25/2019 15:19    Procedures Procedures (including critical care time)  Medications Ordered in ED Medications  acetaminophen (TYLENOL) tablet 1,000 mg (1,000 mg Oral Given 12/25/19 1716)  ondansetron (ZOFRAN-ODT) disintegrating tablet 4 mg (4 mg Oral Given 12/25/19 1441)  ondansetron (ZOFRAN) injection 4 mg (4 mg Intravenous Given 12/25/19 1716)  sodium chloride 0.9 % bolus 1,000 mL (0 mLs Intravenous Stopped 12/25/19 1954)  acetaminophen (TYLENOL) suppository 650 mg (650 mg Rectal Given 12/25/19 1838)  ketorolac (TORADOL) 15 MG/ML injection 15 mg (15 mg Intravenous Given 12/25/19 1835)  LORazepam (ATIVAN) injection 0.5 mg (0.5 mg Intravenous Given 12/25/19 1834)  sodium chloride 0.9 % bolus 1,000 mL (1,000 mLs Intravenous New Bag/Given  12/25/19 1954)    ED Course  I have reviewed the triage vital signs and the nursing notes.  Pertinent labs & imaging results that were available during my care of the patient were reviewed by me and considered in my medical decision making (see chart for details).  Patient is 44 year old female with past medical history detailed above presented today with nausea vomiting diarrhea fatigue malaise body aches.  She does not have any significant shortness of breath but is somewhat tachypneic initially.  Patient does appear clinically significantly dehydrated.  I suspect this is secondary to her poor p.o. intake and episodes of diarrhea.  Physical exam is notable for no abdominal tenderness pulmonary exam is normal.  Plan is to fluid resuscitate patient and give antipyretics and reassess.  Clinical Course as of Dec 24 2099  Nancy Fetter Dec 25, 2019  6333 Patient initially initially given Tylenol and Zofran SL however she vomited immediately after and states that she did not receive any of the Zofran.  She also had IV Zofran given however was not able to be pushed because of IV dysfunction.  At the time of this course updated she has received a small amount of IV fluids and has now received IV Zofran, Ativan, Toradol and Tylenol suppository.  Her tachycardia has been consistent at 130-135 and her temperature has worsened to 103.1.   [WF]  1925 Patient reassessed at this time.  Tachycardia has improved to 110s.  Blood pressure within normal limits.  RN staff will reassess temperature.  She is not tachypneic at  this time.  She states she feels much better has not had any episodes of emesis since successful antinausea medicine was given.  Will receive 1 additional liter of normal saline and reassess likely disposition is to discharge home with Zofran and Tylenol ibuprofen and fluids.   [WF]    Clinical Course User Index [WF] Tedd Sias, [place]    MDM Rules/Calculators/A&P                           Pietrina Jagodzinski was evaluated in Emergency Department on 12/25/2019 for the symptoms described in the history of present illness. She was evaluated in the context of the global COVID-19 pandemic, which necessitated consideration that the patient might be at risk for infection with the SARS-CoV-2 virus that causes COVID-19. Institutional protocols and algorithms that pertain to the evaluation of patients at risk for COVID-19 are in a state of rapid change based on information released by regulatory bodies including the CDC and federal and state organizations. These policies and algorithms were followed during the patient's care in the ED.  Discussed case with [facility].  They will attempt to contact patient and set up outpatient infusion.  On my reevaluation prior to discharge patient is normally tachycardic with heart rate of 100 respiratory rate is within normal limits and SPO2 is 97% on room air.  She did have an SPO2 measurement recorded of 90% however I have not observed her oxygen being this low.  She states she has no shortness of breath.  She is agreeable to discharge with monoclonal antibody infusion center follow-up and return precautions.  She states she feels significantly improved.  -----  Patient's tachycardia improved to HR 90-100 on my final examination. Temp is WNLs and RR is 22-24. SpO2 97% on room air. She feels much improved.   I discussed iwht [facility] who will reach out to patient.   Abd exam continues to be benign and lungs clear.  Final Clinical Impression(s) / ED Diagnoses Final diagnoses:  COVID-19  Dehydration  Nausea vomiting and diarrhea  Fever, unspecified fever cause    Rx / DC Orders ED Discharge Orders         Ordered    ondansetron (ZOFRAN ODT) 4 MG disintegrating tablet  Every 8 hours PRN        12/25/19 2035    methocarbamol (ROBAXIN) 500 MG tablet  2 times daily        12/25/19 2035    promethazine-dextromethorphan  (PROMETHAZINE-DM) 6.25-15 MG/5ML syrup  4 times daily PRN        12/25/19 2035    benzonatate (TESSALON) 100 MG capsule  Every 8 hours        12/25/19 2035           Tedd Sias, [place] 12/26/19 1617    Carmin Muskrat, MD 12/29/19 1006

## 2019-12-25 NOTE — Discharge Instructions (Signed)
You have COVID-19.  This is a viral disease or can take several weeks for complete improvement of symptoms.  In the meantime we will try to improve her symptoms by taking Tylenol, ibuprofen and Zofran.  I also have given you some cough medicines.  Please use Tylenol or ibuprofen for pain.  You may use 600 mg ibuprofen every 6 hours or 1000 mg of Tylenol every 6 hours.  You may choose to alternate between the 2.  This would be most effective.  Not to exceed 4 g of Tylenol within 24 hours.  Not to exceed 3200 mg ibuprofen 24 hours. Please take these medicines regularly and consistently.   Please take over the counter vitamin D 2000-4000 units per day. I also recommend zinc 50 mg per day for the next two weeks.   Please return to ED if you feel have difficulty breathing or have emergent, new or concerning symptoms.  Patients who have symptoms consistent with COVID-19 should self isolated for: At least 3 days (72 hours) have passed since recovery, defined as resolution of fever without the use of fever reducing medications and improvement in respiratory symptoms (e.g., cough, shortness of breath), and At least 7 days have passed since symptoms first appeared.       Person Under Monitoring Name: Anita Padilla  Location: [address] [place] [address]   Infection Prevention Recommendations for Individuals Confirmed to have, or Being Evaluated for, 2019 Novel Coronavirus (COVID-19) Infection Who Receive Care at Home  Individuals who are confirmed to have, or are being evaluated for, COVID-19 should follow the prevention steps below until a healthcare provider or local or state health department says they can return to normal activities.  Stay home except to get medical care You should restrict activities outside your home, except for getting medical care. Do not go to work, school, or public areas, and do not use public transportation or taxis.  Call ahead before  visiting your doctor Before your medical appointment, call the healthcare provider and tell them that you have, or are being evaluated for, COVID-19 infection. This will help the healthcare provider's office take steps to keep other people from getting infected. Ask your healthcare provider to call the local or state health department.  Monitor your symptoms Seek prompt medical attention if your illness is worsening (e.g., difficulty breathing). Before going to your medical appointment, call the healthcare provider and tell them that you have, or are being evaluated for, COVID-19 infection. Ask your healthcare provider to call the local or state health department.  Wear a facemask You should wear a facemask that covers your nose and mouth when you are in the same room with other people and when you visit a healthcare provider. People who live with or visit you should also wear a facemask while they are in the same room with you.  Separate yourself from other people in your home As much as possible, you should stay in a different room from other people in your home. Also, you should use a separate bathroom, if available.  Avoid sharing household items You should not share dishes, drinking glasses, cups, eating utensils, towels, bedding, or other items with other people in your home. After using these items, you should wash them thoroughly with soap and water.  Cover your coughs and sneezes Cover your mouth and nose with a tissue when you cough or sneeze, or you can cough or sneeze into your sleeve. Throw used tissues in  a lined trash can, and immediately wash your hands with soap and water for at least 20 seconds or use an alcohol-based hand rub.  Wash your [facility] your hands often and thoroughly with soap and water for at least 20 seconds. You can use an alcohol-based hand sanitizer if soap and water are not available and if your hands are not visibly dirty. Avoid touching your eyes,  nose, and mouth with unwashed hands.   Prevention Steps for Caregivers and Household Members of Individuals Confirmed to have, or Being Evaluated for, COVID-19 Infection Being Cared for in the Home  If you live with, or provide care at home for, a person confirmed to have, or being evaluated for, COVID-19 infection please follow these guidelines to prevent infection:  Follow healthcare provider's instructions Make sure that you understand and can help the patient follow any healthcare provider instructions for all care.  Provide for the patient's basic needs You should help the patient with basic needs in the home and provide support for getting groceries, prescriptions, and other personal needs.  Monitor the patient's symptoms If they are getting sicker, call his or her medical provider and tell them that the patient has, or is being evaluated for, COVID-19 infection. This will help the healthcare provider's office take steps to keep other people from getting infected. Ask the healthcare provider to call the local or state health department.  Limit the number of people who have contact with the patient If possible, have only one caregiver for the patient. Other household members should stay in another home or place of residence. If this is not possible, they should stay in another room, or be separated from the patient as much as possible. Use a separate bathroom, if available. Restrict visitors who do not have an essential need to be in the home.  Keep older adults, very young children, and other sick people away from the patient Keep older adults, very young children, and those who have compromised immune systems or chronic health conditions away from the patient. This includes people with chronic heart, lung, or kidney conditions, diabetes, and cancer.  Ensure good ventilation Make sure that shared spaces in the home have good air flow, such as from an air conditioner or an opened  window, weather permitting.  Wash your hands often Wash your hands often and thoroughly with soap and water for at least 20 seconds. You can use an alcohol based hand sanitizer if soap and water are not available and if your hands are not visibly dirty. Avoid touching your eyes, nose, and mouth with unwashed hands. Use disposable paper towels to dry your hands. If not available, use dedicated cloth towels and replace them when they become wet.  Wear a facemask and gloves Wear a disposable facemask at all times in the room and gloves when you touch or have contact with the patient's blood, body fluids, and/or secretions or excretions, such as sweat, saliva, sputum, nasal mucus, vomit, urine, or feces.  Ensure the mask fits over your nose and mouth tightly, and do not touch it during use. Throw out disposable facemasks and gloves after using them. Do not reuse. Wash your hands immediately after removing your facemask and gloves. If your personal clothing becomes contaminated, carefully remove clothing and launder. Wash your hands after handling contaminated clothing. Place all used disposable facemasks, gloves, and other waste in a lined container before disposing them with other household waste. Remove gloves and wash your hands immediately after handling  these items.  Do not share dishes, glasses, or other household items with the patient Avoid sharing household items. You should not share dishes, drinking glasses, cups, eating utensils, towels, bedding, or other items with a patient who is confirmed to have, or being evaluated for, COVID-19 infection. After the person uses these items, you should wash them thoroughly with soap and water.  Wash laundry thoroughly Immediately remove and wash clothes or bedding that have blood, body fluids, and/or secretions or excretions, such as sweat, saliva, sputum, nasal mucus, vomit, urine, or feces, on them. Wear gloves when handling laundry from the  patient. Read and follow directions on labels of laundry or clothing items and detergent. In general, wash and dry with the warmest temperatures recommended on the label.  Clean all areas the individual has used often Clean all touchable surfaces, such as counters, tabletops, doorknobs, bathroom fixtures, toilets, phones, keyboards, tablets, and bedside tables, every day. Also, clean any surfaces that may have blood, body fluids, and/or secretions or excretions on them. Wear gloves when cleaning surfaces the patient has come in contact with. Use a diluted bleach solution (e.g., dilute bleach with 1 part bleach and 10 parts water) or a household disinfectant with a label that says EPA-registered for coronaviruses. To make a bleach solution at home, add 1 tablespoon of bleach to 1 quart (4 cups) of water. For a larger supply, add  cup of bleach to 1 gallon (16 cups) of water. Read labels of cleaning products and follow recommendations provided on product labels. Labels contain instructions for safe and effective use of the cleaning product including precautions you should take when applying the product, such as wearing gloves or eye protection and making sure you have good ventilation during use of the product. Remove gloves and wash hands immediately after cleaning.  Monitor yourself for signs and symptoms of illness Caregivers and household members are considered close contacts, should monitor their health, and will be asked to limit movement outside of the home to the extent possible. Follow the monitoring steps for close contacts listed on the symptom monitoring form.   ? If you have additional questions, contact your local health department or call the epidemiologist on call at [phone] (available 24/7). ? This guidance is subject to change. For the most up-to-date guidance from [facility], please refer to their website: [email or web address]

## 2019-12-26 ENCOUNTER — Telehealth: Payer: Self-pay | Admitting: Unknown Physician Specialty

## 2019-12-26 ENCOUNTER — Other Ambulatory Visit: Payer: Self-pay | Admitting: Unknown Physician Specialty

## 2019-12-26 ENCOUNTER — Ambulatory Visit (HOSPITAL_COMMUNITY)
Admission: RE | Admit: 2019-12-26 | Discharge: 2019-12-26 | Disposition: A | Discharge status: Home / Self Care | Payer: PRIVATE HEALTH INSURANCE | Source: Ambulatory Visit | Attending: Pulmonary Disease | Admitting: Pulmonary Disease

## 2019-12-26 DIAGNOSIS — U071 COVID-19: Secondary | ICD-10-CM | POA: Diagnosis not present

## 2019-12-26 MED ORDER — FAMOTIDINE IN NACL 20-0.9 MG/50ML-% IV SOLN: 20.0000 mg | Freq: Once | INTRAVENOUS | Status: DC | PRN

## 2019-12-26 MED ORDER — DIPHENHYDRAMINE HCL 50 MG/ML IJ SOLN: 50.0000 mg | Freq: Once | INTRAMUSCULAR | Status: DC | PRN

## 2019-12-26 MED ORDER — ONDANSETRON HCL 4 MG/2ML IJ SOLN: 4.0000 mg | Freq: Once | INTRAMUSCULAR | Status: AC

## 2019-12-26 MED ORDER — SODIUM CHLORIDE 0.9 % IV SOLN
1200.0000 mg | Freq: Once | INTRAVENOUS | Status: AC
  Administered 2019-12-26: 1200 mg via INTRAVENOUS

## 2019-12-26 MED ORDER — METHYLPREDNISOLONE SODIUM SUCC 125 MG IJ SOLR: 125.0000 mg | Freq: Once | INTRAMUSCULAR | Status: DC | PRN

## 2019-12-26 MED ORDER — ONDANSETRON HCL 4 MG/2ML IJ SOLN
INTRAMUSCULAR | Status: AC
  Administered 2019-12-26: 4 mg via INTRAVENOUS
  Filled 2019-12-26: qty 2

## 2019-12-26 MED ORDER — SODIUM CHLORIDE 0.9 % IV SOLN: INTRAVENOUS | Status: DC | PRN

## 2019-12-26 MED ORDER — ALBUTEROL SULFATE HFA 108 (90 BASE) MCG/ACT IN AERS: 2.0000 | INHALATION_SPRAY | Freq: Once | RESPIRATORY_TRACT | Status: DC | PRN

## 2019-12-26 MED ORDER — ACETAMINOPHEN 325 MG PO TABS
650.0000 mg | ORAL_TABLET | Freq: Once | ORAL | Status: AC
  Administered 2019-12-26: 650 mg via ORAL
  Filled 2019-12-26: qty 2

## 2019-12-26 MED ORDER — EPINEPHRINE 0.3 MG/0.3ML IJ SOAJ: 0.3000 mg | Freq: Once | INTRAMUSCULAR | Status: DC | PRN

## 2019-12-26 NOTE — Discharge Instructions (Signed)

## 2019-12-26 NOTE — Telephone Encounter (Signed)
I connected by phone with Anita Padilla on 12/26/2019 at 10:31 AM to discuss the potential use of a new treatment for mild to moderate COVID-19 viral infection in non-hospitalized patients.  This patient is a 44 y.o. female that meets the FDA criteria for Emergency Use Authorization of COVID monoclonal antibody casirivimab/imdevimab.  Has a (+) direct SARS-CoV-2 viral test result  Has mild or moderate COVID-19   Is NOT hospitalized due to COVID-19  Is within 10 days of symptom onset  Has at least one of the high risk factor(s) for progression to severe COVID-19 and/or hospitalization as defined in EUA.  Specific high risk criteria : BMI > 25   I have spoken and communicated the following to the patient or parent/caregiver regarding COVID monoclonal antibody treatment:  1. FDA has authorized the emergency use for the treatment of mild to moderate COVID-19 in adults and pediatric patients with positive results of direct SARS-CoV-2 viral testing who are 44 years of age and older weighing at least 40 kg, and who are at high risk for progressing to severe COVID-19 and/or hospitalization.  2. The significant known and potential risks and benefits of COVID monoclonal antibody, and the extent to which such potential risks and benefits are unknown.  3. Information on available alternative treatments and the risks and benefits of those alternatives, including clinical trials.  4. Patients treated with COVID monoclonal antibody should continue to self-isolate and use infection control measures (e.g., wear mask, isolate, social distance, avoid sharing personal items, clean and disinfect "high touch" surfaces, and frequent handwashing) according to CDC guidelines.   5. The patient or parent/caregiver has the option to accept or refuse COVID monoclonal antibody treatment.  After reviewing this information with the patient, The patient agreed to proceed with receiving casirivimab\imdevimab infusion  and will be provided a copy of the Fact sheet prior to receiving the infusion. [name] 12/26/2019 10:31 AM Day 8 of symptoms

## 2019-12-26 NOTE — Progress Notes (Signed)
  Diagnosis: COVID-19  Physician: Dr. Joya Gaskins   Procedure: [facility] Med: casirivimab\imdevimab infusion - Provided patient with casirivimab\imdevimab fact sheet for patients, parents and caregivers prior to infusion.  Complications: No immediate complications noted. Pt had temp of 101.5 at d/c. Pt given 650mg  Tylenol.   Discharge: Discharged home   Cheri Guppy 12/26/2019

## 2020-09-23 IMAGING — DX DG CHEST 1V PORT
1 series · 1 of 1 positions shown · non-contrast
Comparison: June 07, 2019

CLINICAL DATA: COVID positive with body aches and fever.

EXAM:
PORTABLE CHEST 1 VIEW

[chest ap]
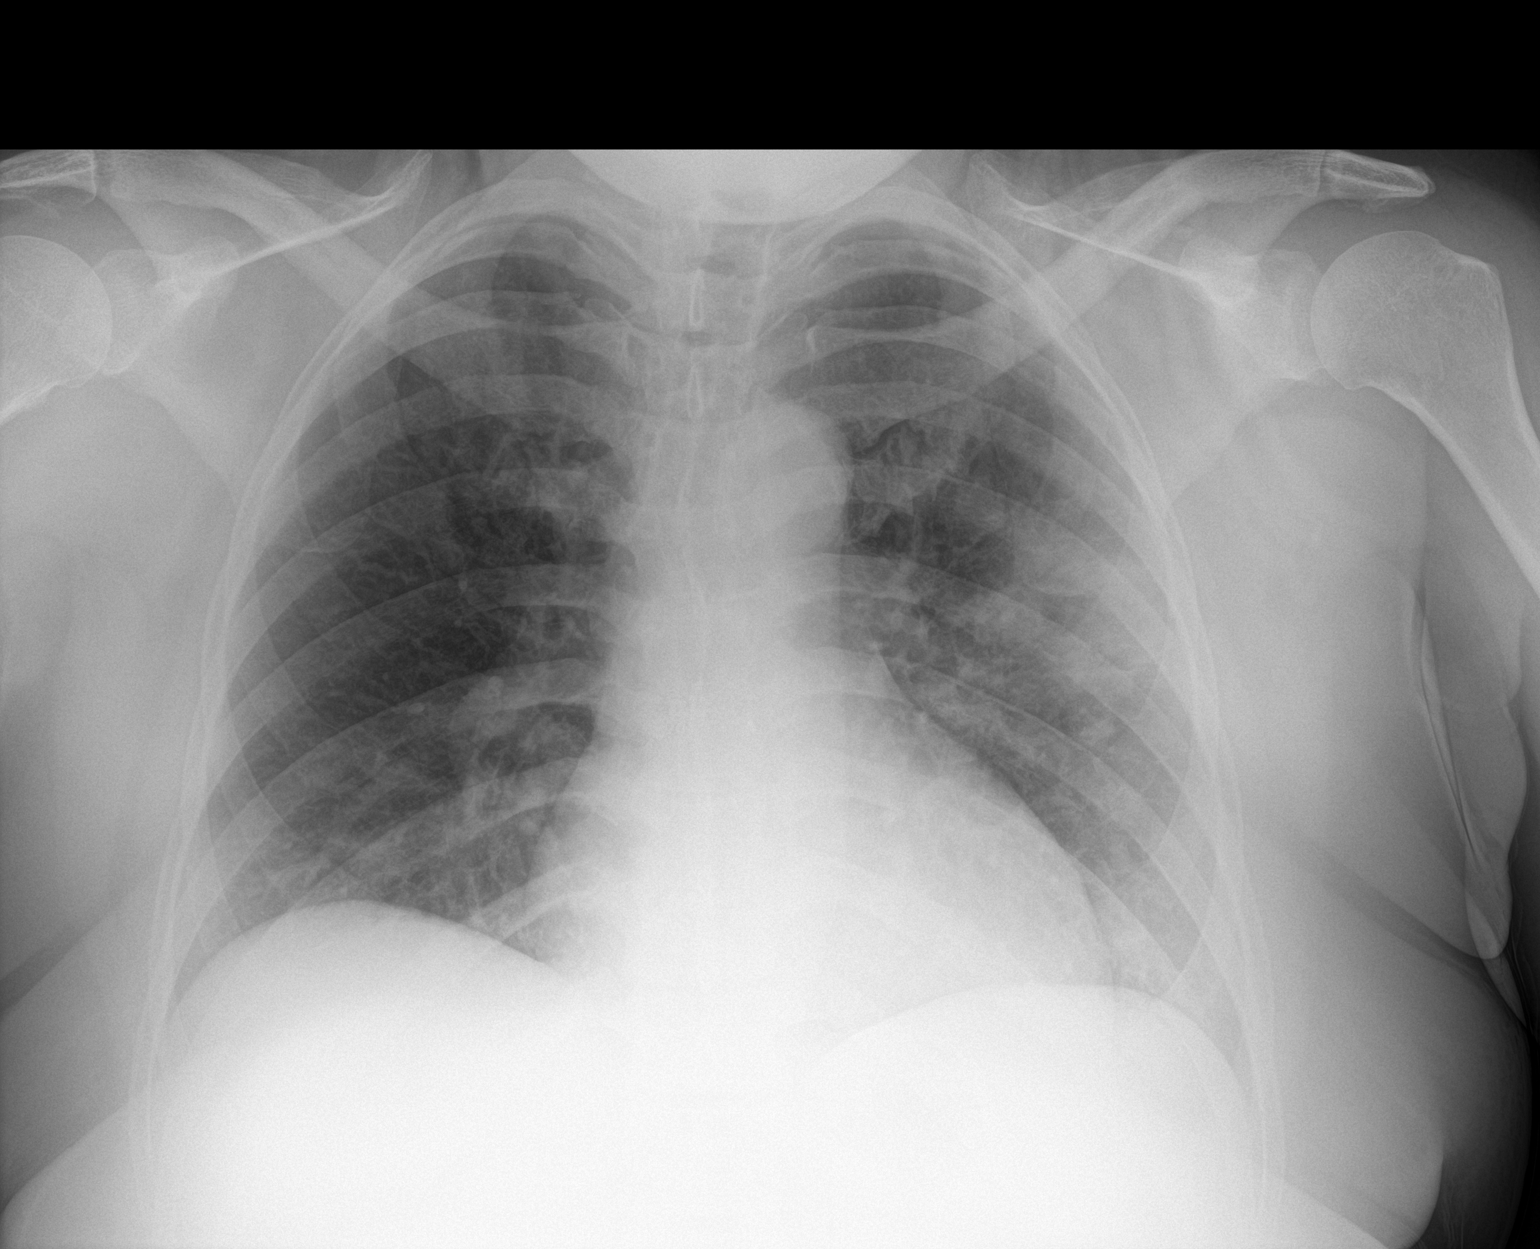

[1 of 1 positions shown; findings below may reference images not displayed]

FINDINGS: Mild hazy infiltrates are seen within the mid left lung. There is no
evidence of a pleural effusion or pneumothorax. The heart size and
mediastinal contours are within normal limits. The visualized
skeletal structures are unremarkable.
IMPRESSION: Mild hazy mid left lung infiltrates.

## 2021-11-04 ENCOUNTER — Encounter: Payer: Self-pay | Admitting: Family

## 2021-11-04 ENCOUNTER — Ambulatory Visit: Payer: 59 | Admitting: Family

## 2021-11-04 VITALS — BP 110/68 | HR 97 | Temp 97.5°F | Resp 18 | Ht 68.0 in | Wt 223.0 lb

## 2021-11-04 DIAGNOSIS — R5383 Other fatigue: Secondary | ICD-10-CM

## 2021-11-04 DIAGNOSIS — E782 Mixed hyperlipidemia: Secondary | ICD-10-CM | POA: Diagnosis not present

## 2021-11-04 DIAGNOSIS — J302 Other seasonal allergic rhinitis: Secondary | ICD-10-CM | POA: Insufficient documentation

## 2021-11-04 DIAGNOSIS — F5101 Primary insomnia: Secondary | ICD-10-CM | POA: Diagnosis not present

## 2021-11-04 DIAGNOSIS — E041 Nontoxic single thyroid nodule: Secondary | ICD-10-CM

## 2021-11-04 DIAGNOSIS — Z7689 Persons encountering health services in other specified circumstances: Secondary | ICD-10-CM

## 2021-11-04 NOTE — Assessment & Plan Note (Signed)
BMI 33.91 Continue with dietary modification and exercise

## 2021-11-04 NOTE — Progress Notes (Signed)
Provider: Marlowe Sax FNP-C   Huber Mathers, Nelda Bucks, NP  Patient Care Team: Mirka Barbone, Nelda Bucks, NP as PCP - General (Family Medicine)  Extended Emergency Contact Information Primary Emergency Contact: Wynn Maudlin Mobile Phone: [phone] Relation: Sister  Code Status:  Full Code  Goals of care: Advanced Directive information    11/04/2021    8:24 AM  Advanced Directives  Does Patient Have a Medical Advance Directive? No  Would patient like information on creating a medical advance directive? No - Patient declined     Chief Complaint  Patient presents with   Establish Care    Patient is here to establish care    HPI:  Pt is a 46 y.o. female seen today establish care here at [facility] for medical management of chronic diseases.  Has a medical history of hypertension, hyperlipidemia, right thyroid nodule post ultrasound and biopsy several years ago, seasonal allergies, obesity among others.  Has not seen primary provider for over 3 years.  Does exercise strength training,weight lifting and cardio x 1-2 hrs three to 4 times per week.  Drinks 2 liters per day of water.  Diets includes protein,veggies and fruits.Does not eat out and avoids sugars and sodas.  Has had significant weight loss.  Per record has had 5 to 6 pounds weight loss over 2 years.  Does not drinking alcohol or smoke or other illicit drugs.   LMP 10/07/2021.uses condom for protection.  Right side Thyroid nodule - has had ultrasound and biopsy which was benign.   Health maintenance: Due for Pap smear screening states last Pap smear was over 3 years ago.  She denies any symptoms. Stated previously is screening for hep C and HIV which were both negative.  No records for review.  Will obtain records from previous provider and then update.   Past Medical History:  Diagnosis Date   Anxiety    Back pain    Borderline hypertension    Depression    Dysmenorrhea    Fibroid    uterine    Hyperlipidemia    Joint pain    Lower extremity edema    Ovarian cyst    Seasonal allergies    Thyroid nodule    Thyroid nodule    Past Surgical History:  Procedure Laterality Date   CERVICAL CERCLAGE      No Known Allergies  Allergies as of 11/04/2021   No Known Allergies      Medication List        Accurate as of November 04, 2021  9:40 AM. If you have any questions, ask your nurse or doctor.          STOP taking these medications    benzonatate 100 MG capsule Commonly known as: TESSALON Stopped by: Sandrea Hughs, NP   citalopram 40 MG tablet Commonly known as: CELEXA Stopped by: Sandrea Hughs, NP   fexofenadine 180 MG tablet Commonly known as: ALLEGRA Stopped by: Sandrea Hughs, NP   methocarbamol 500 MG tablet Commonly known as: ROBAXIN Stopped by: Sandrea Hughs, NP   ondansetron 4 MG disintegrating tablet Commonly known as: Zofran ODT Stopped by: Sandrea Hughs, NP   promethazine-dextromethorphan 6.25-15 MG/5ML syrup Commonly known as: PROMETHAZINE-DM Stopped by: Sandrea Hughs, NP   zolpidem 10 MG tablet Commonly known as: AMBIEN Stopped by: Sandrea Hughs, NP       TAKE these medications    multivitamin with minerals tablet Take 1 tablet by  mouth daily.   TRAZODONE HCL ER PO Take 100 mg by mouth daily.   ZyrTEC Allergy 10 MG Caps Generic drug: Cetirizine HCl Take 10 mg by mouth daily.        Review of Systems  Constitutional:  Negative for appetite change, chills, fatigue, fever and unexpected weight change.  HENT:  Negative for congestion, dental problem, ear discharge, ear pain, facial swelling, hearing loss, nosebleeds, postnasal drip, rhinorrhea, sinus pressure, sinus pain, sneezing, sore throat, tinnitus and trouble swallowing.   Eyes:  Negative for pain, discharge, redness, itching and visual disturbance.  Respiratory:  Negative for cough, chest tightness, shortness of breath and wheezing.   Cardiovascular:   Negative for chest pain, palpitations and leg swelling.  Gastrointestinal:  Negative for abdominal distention, abdominal pain, blood in stool, constipation, diarrhea, nausea and vomiting.  Endocrine: Negative for cold intolerance, heat intolerance, polydipsia, polyphagia and polyuria.  Genitourinary:  Negative for difficulty urinating, dysuria, flank pain, frequency and urgency.  Musculoskeletal:  Negative for arthralgias, back pain, gait problem, joint swelling, myalgias, neck pain and neck stiffness.  Skin:  Negative for color change, pallor, rash and wound.  Neurological:  Negative for dizziness, syncope, speech difficulty, weakness, light-headedness, numbness and headaches.  Hematological:  Does not bruise/bleed easily.  Psychiatric/Behavioral:  Negative for agitation, behavioral problems, confusion, hallucinations, self-injury, sleep disturbance and suicidal ideas. The patient is not nervous/anxious.     Immunization History  Administered Date(s) Administered   Influenza-Unspecified 03/28/2016   PPD Test 11/25/2019, 11/16/2020, 11/26/2020   Tdap 04/28/2009, 11/24/2018   Pertinent  Health Maintenance Due  Topic Date Due   PAP SMEAR-Modifier  Never done   INFLUENZA VACCINE  11/26/2021      10/02/2019    1:13 PM 12/21/2019    2:38 PM 12/22/2019    1:23 AM 12/25/2019    2:37 PM 11/04/2021    8:24 AM  Fall Risk  Falls in the past year?     0  Was there an injury with Fall?     0  Fall Risk Category Calculator     0  Fall Risk Category     Low  Patient Fall Risk Level Low fall risk Low fall risk Low fall risk Low fall risk Low fall risk  Patient at Risk for Falls Due to     No Fall Risks  Fall risk Follow up     Falls evaluation completed   Functional Status Survey:    Vitals:   11/04/21 0922  BP: 110/68  Pulse: 97  Resp: 18  Temp: (!) 97.5 F (36.4 C)  TempSrc: Temporal  SpO2: 97%  Weight: 223 lb (101.2 kg)  Height: '5\' 8"'  (1.727 m)   Body mass index is 33.91  kg/m. Physical Exam Vitals reviewed.  Constitutional:      General: She is not in acute distress.    Appearance: Normal appearance. She is obese. She is not ill-appearing or diaphoretic.  HENT:     Head: Normocephalic.     Right Ear: Tympanic membrane, ear canal and external ear normal. There is no impacted cerumen.     Left Ear: Tympanic membrane, ear canal and external ear normal. There is no impacted cerumen.     Nose: Nose normal. No congestion or rhinorrhea.     Mouth/Throat:     Mouth: Mucous membranes are moist.     Pharynx: Oropharynx is clear. No oropharyngeal exudate or posterior oropharyngeal erythema.  Eyes:     General: No scleral icterus.  Right eye: No discharge.        Left eye: No discharge.     Extraocular Movements: Extraocular movements intact.     Conjunctiva/sclera: Conjunctivae normal.     Pupils: Pupils are equal, round, and reactive to light.  Neck:     Thyroid: Thyroid mass present. No thyromegaly or thyroid tenderness.     Vascular: No carotid bruit.     Comments: Right side thyroid mass firm and  nontender to palpation Cardiovascular:     Rate and Rhythm: Normal rate and regular rhythm.     Pulses: Normal pulses.     Heart sounds: Normal heart sounds. No murmur heard.    No friction rub. No gallop.  Pulmonary:     Effort: Pulmonary effort is normal. No respiratory distress.     Breath sounds: Normal breath sounds. No wheezing, rhonchi or rales.  Chest:     Chest wall: No tenderness.  Abdominal:     General: Bowel sounds are normal. There is no distension.     Palpations: Abdomen is soft. There is no mass.     Tenderness: There is no abdominal tenderness. There is no right CVA tenderness, left CVA tenderness, guarding or rebound.  Musculoskeletal:        General: No swelling or tenderness. Normal range of motion.     Cervical back: Normal range of motion. No rigidity or tenderness.     Right lower leg: No edema.     Left lower leg: No edema.   Lymphadenopathy:     Cervical: No cervical adenopathy.  Skin:    General: Skin is warm and dry.     Coloration: Skin is not pale.     Findings: No bruising, erythema, lesion or rash.  Neurological:     Mental Status: She is alert and oriented to person, place, and time.     Cranial Nerves: No cranial nerve deficit.     Sensory: No sensory deficit.     Motor: No weakness.     Coordination: Coordination normal.     Gait: Gait normal.  Psychiatric:        Mood and Affect: Mood normal.        Speech: Speech normal.        Behavior: Behavior normal.        Thought Content: Thought content normal.        Judgment: Judgment normal.     Labs reviewed: No results for input(s): "NA", "K", "CL", "CO2", "GLUCOSE", "BUN", "CREATININE", "CALCIUM", "MG", "PHOS" in the last 8760 hours. No results for input(s): "AST", "ALT", "ALKPHOS", "BILITOT", "PROT", "ALBUMIN" in the last 8760 hours. No results for input(s): "WBC", "NEUTROABS", "HGB", "HCT", "MCV", "PLT" in the last 8760 hours. Lab Results  Component Value Date   TSH 1.420 10/27/2019   Lab Results  Component Value Date   HGBA1C 5.2 10/27/2019   Lab Results  Component Value Date   CHOL 199 10/27/2019   HDL 70 10/27/2019   LDLCALC 120 (H) 10/27/2019   TRIG 49 10/27/2019    Significant Diagnostic Results in last 30 days:  No results found.  Assessment/Plan 1. Encounter to establish care Available records reviewed.  Immunization up-to-date. Will obtain records for hep C and HIV screening that was done in the past.  Recommended scheduling for fasting blood work and Pap smear.  2. Primary insomnia Trazodone as needed  3. Seasonal allergies Zyrtec effective  4. Mixed hyperlipidemia No latest LDL for review Advised to continue with  dietary modification and exercise - Lipid panel; Future  5. Thyroid nodule Right thyroid nodule firm, enlarge and nontender to palpate.  Would like referral to endocrinologist.  Has had previous  ultrasound and biopsy but was not ready for surgical procedure but would like to consider procedure. - TSH; Future - Ambulatory referral to Endocrinology  6. Fatigue, unspecified type Unclear etiology will rule out acute and metabolic issues - TSH; Future - COMPLETE METABOLIC PANEL WITH GFR; Future - CBC with Differential/Platelet; Future 7.  Morbid obesity BMI 33.91 Advised to continue with dietary modification and exercise  Family/ staff Communication: Reviewed plan of care with patient verbalized understanding.  Labs/tests ordered:  - CBC with Differential/Platelet - CMP with eGFR([facility]) - TSH - Lipid panel  Next Appointment : Return in about 6 months (around 05/07/2022) for medical mangement of chronic issues., fasting labs and Pap smear 11/07/2021 .   Sandrea Hughs, NP

## 2021-11-06 ENCOUNTER — Ambulatory Visit: Payer: 59 | Admitting: Family

## 2021-11-11 ENCOUNTER — Ambulatory Visit: Payer: 59 | Admitting: Family

## 2021-11-11 ENCOUNTER — Encounter: Payer: Self-pay | Admitting: Family

## 2021-11-11 VITALS — BP 118/74 | HR 74 | Temp 97.8°F | Resp 18 | Ht 68.0 in | Wt 221.0 lb

## 2021-11-11 DIAGNOSIS — E041 Nontoxic single thyroid nodule: Secondary | ICD-10-CM | POA: Diagnosis not present

## 2021-11-11 DIAGNOSIS — E782 Mixed hyperlipidemia: Secondary | ICD-10-CM

## 2021-11-11 DIAGNOSIS — Z0000 Encounter for general adult medical examination without abnormal findings: Secondary | ICD-10-CM | POA: Diagnosis not present

## 2021-11-11 DIAGNOSIS — R5383 Other fatigue: Secondary | ICD-10-CM

## 2021-11-11 NOTE — Patient Instructions (Signed)

## 2021-11-11 NOTE — Progress Notes (Signed)
Provider: Marlowe Sax FNP-C   Jacinda Kanady, Nelda Bucks, NP  Patient Care Team: Kajal Scalici, Nelda Bucks, NP as PCP - General (Family Medicine)  Extended Emergency Contact Information Primary Emergency Contact: Bozard,Dolly Mobile Phone: [phone] Relation: Mother Interpreter needed? No Secondary Emergency Contact: Iuka Mobile Phone: [phone] Relation: Sister  Code Status:  Full Code  Goals of care: Advanced Directive information    11/11/2021    8:29 AM  Advanced Directives  Does Patient Have a Medical Advance Directive? No  Would patient like information on creating a medical advance directive? No - Patient declined     Chief Complaint  Patient presents with   Annual Exam    Patient is here for pap smear and fasting lab work    HPI:  Pt is a 46 y.o. female seen today for Pap smear and fasting blood work.  She denies any vaginal bleeding or vaginal discharge.  Also no abdominal pain, bloating nausea or vomiting sounds of breast exam reports no symptoms. She is fasting today for fasting blood work   Past Medical History:  Diagnosis Date   Anxiety    Back pain    Borderline hypertension    Depression    Dysmenorrhea    Fibroid    uterine   Hyperlipidemia    Joint pain    Lower extremity edema    Ovarian cyst    Seasonal allergies    Thyroid nodule    Thyroid nodule    Past Surgical History:  Procedure Laterality Date   CERVICAL CERCLAGE      No Known Allergies  Allergies as of 11/11/2021   No Known Allergies      Medication List        Accurate as of November 11, 2021 10:08 AM. If you have any questions, ask your nurse or doctor.          multivitamin with minerals tablet Take 1 tablet by mouth daily.   TRAZODONE HCL ER PO Take 100 mg by mouth daily.   ZyrTEC Allergy 10 MG Caps Generic drug: Cetirizine HCl Take 10 mg by mouth daily.        Review of Systems  Constitutional:  Negative for appetite change, chills, fatigue,  fever and unexpected weight change.  Eyes:  Negative for pain, discharge, redness, itching and visual disturbance.  Respiratory:  Negative for cough, chest tightness, shortness of breath and wheezing.   Cardiovascular:  Negative for chest pain, palpitations and leg swelling.  Gastrointestinal:  Negative for abdominal distention, abdominal pain, blood in stool, constipation, diarrhea, nausea and vomiting.  Genitourinary:  Negative for difficulty urinating, dysuria, flank pain, frequency and urgency.  Skin:  Negative for color change, pallor, rash and wound.  Neurological:  Negative for dizziness, syncope, speech difficulty, weakness, light-headedness, numbness and headaches.    Immunization History  Administered Date(s) Administered   Influenza-Unspecified 03/28/2016   PFIZER(Purple Top)SARS-COV-2 Vaccination 04/07/2020, 04/30/2020   PPD Test 11/25/2019, 11/16/2020, 11/26/2020   Tdap 04/28/2009, 11/24/2018   Pertinent  Health Maintenance Due  Topic Date Due   PAP SMEAR-Modifier  Never done   INFLUENZA VACCINE  11/26/2021      12/21/2019    2:38 PM 12/22/2019    1:23 AM 12/25/2019    2:37 PM 11/04/2021    8:24 AM 11/11/2021    8:29 AM  Fall Risk  Falls in the past year?    0 0  Was there an injury with Fall?    0 0  Fall Risk  Category Calculator    0 0  Fall Risk Category    Low Low  Patient Fall Risk Level Low fall risk Low fall risk Low fall risk Low fall risk Low fall risk  Patient at Risk for Falls Due to    No Fall Risks No Fall Risks  Fall risk Follow up    Falls evaluation completed Falls evaluation completed   Functional Status Survey:    Vitals:   11/11/21 0917  BP: 118/74  Pulse: 74  Resp: 18  Temp: 97.8 F (36.6 C)  TempSrc: Temporal  SpO2: 98%  Weight: 221 lb (100.2 kg)  Height: 5' 8" (1.727 m)  HC: 68" (172.7 cm)   Body mass index is 33.6 kg/m. Physical Exam Vitals reviewed. Exam conducted with a chaperone present.  Constitutional:      General: She is  not in acute distress.    Appearance: Normal appearance. She is normal weight. She is not ill-appearing or diaphoretic.  HENT:     Head: Normocephalic.     Right Ear: Tympanic membrane, ear canal and external ear normal. There is no impacted cerumen.     Left Ear: Tympanic membrane, ear canal and external ear normal. There is no impacted cerumen.     Nose: Nose normal. No congestion or rhinorrhea.     Mouth/Throat:     Mouth: Mucous membranes are moist.     Pharynx: Oropharynx is clear. No oropharyngeal exudate or posterior oropharyngeal erythema.  Eyes:     General: No scleral icterus.       Right eye: No discharge.        Left eye: No discharge.     Conjunctiva/sclera: Conjunctivae normal.     Pupils: Pupils are equal, round, and reactive to light.  Neck:     Vascular: No carotid bruit.     Comments: Right visible thyroid mass nontender to palpation with firm consistency. Cardiovascular:     Rate and Rhythm: Normal rate and regular rhythm.     Pulses: Normal pulses.     Heart sounds: Normal heart sounds. No murmur heard.    No friction rub. No gallop.  Pulmonary:     Effort: Pulmonary effort is normal. No respiratory distress.     Breath sounds: Normal breath sounds. No wheezing, rhonchi or rales.  Chest:     Chest wall: No tenderness.  Abdominal:     General: Bowel sounds are normal. There is no distension.     Palpations: Abdomen is soft. There is no mass.     Tenderness: There is no abdominal tenderness. There is no right CVA tenderness, left CVA tenderness, guarding or rebound.  Genitourinary:    Exam position: Lithotomy position.     Vagina: Normal.     Cervix: Normal.     Uterus: Normal.   Musculoskeletal:        General: No swelling or tenderness. Normal range of motion.     Cervical back: Normal range of motion. No rigidity or tenderness.     Right lower leg: No edema.     Left lower leg: No edema.  Lymphadenopathy:     Cervical: No cervical adenopathy.  Skin:     General: Skin is warm and dry.     Coloration: Skin is not pale.     Findings: No bruising, erythema, lesion or rash.  Neurological:     Mental Status: She is alert and oriented to person, place, and time.     Motor: No   weakness.     Gait: Gait normal.  Psychiatric:        Mood and Affect: Mood normal.        Speech: Speech normal.        Behavior: Behavior normal.     Labs reviewed: No results for input(s): "NA", "K", "CL", "CO2", "GLUCOSE", "BUN", "CREATININE", "CALCIUM", "MG", "PHOS" in the last 8760 hours. No results for input(s): "AST", "ALT", "ALKPHOS", "BILITOT", "PROT", "ALBUMIN" in the last 8760 hours. No results for input(s): "WBC", "NEUTROABS", "HGB", "HCT", "MCV", "PLT" in the last 8760 hours. Lab Results  Component Value Date   TSH 1.420 10/27/2019   Lab Results  Component Value Date   HGBA1C 5.2 10/27/2019   Lab Results  Component Value Date   CHOL 199 10/27/2019   HDL 70 10/27/2019   LDLCALC 120 (H) 10/27/2019   TRIG 49 10/27/2019    Significant Diagnostic Results in last 30 days:  No results found.  Assessment/Plan 1. Fatigue, unspecified type Unclear etiology we will check lab work to rule out acute abnormalities and other metabolic etiologies suspect possible due to thyroid nodule - CBC with Differential/Platelet - COMPLETE METABOLIC PANEL WITH GFR - TSH  2. Thyroid nodule Right thyroid nodule has worsened in size.  Firm and nontender to palpation Referred to endocrinologist on 11/04/2021 has not had follow-up yet. - TSH  3. Mixed hyperlipidemia Dietary modification and exercise advised - Lipid panel  4. Routine general medical examination at a health care facility Up to date with immunization except due for COVID-19 vaccine. Medication and labs reviewed patient counselled regarding yearly exam, prevention of dental and periodontal disease, diet, regular sustained exercise for at least 30 minutes x 3 /week, PHQ 9 up dated.  Family/ staff  Communication: Reviewed plan of care with patient verbalized understanding  Labs/tests ordered:  - CBC with Differential/Platelet - CMP with eGFR([facility]) - TSH - Lipid panel  Next Appointment : Return in about 1 year (around 11/12/2022) for annual Physical examination.   Dinah C Ngetich, NP   

## 2021-11-12 LAB — COMPLETE METABOLIC PANEL WITH GFR
AG Ratio: 1.4 (calc) (ref 1.0–2.5)
ALT: 14 U/L (ref 6–29)
AST: 22 U/L (ref 10–30)
Albumin: 4.2 g/dL (ref 3.6–5.1)
Alkaline phosphatase (APISO): 51 U/L (ref 31–125)
BUN: 14 mg/dL (ref 7–25)
CO2: 27 mmol/L (ref 20–32)
Calcium: 9.6 mg/dL (ref 8.6–10.2)
Chloride: 104 mmol/L (ref 98–110)
Creat: 0.79 mg/dL (ref 0.50–0.99)
Globulin: 3.1 g/dL (calc) (ref 1.9–3.7)
Glucose, Bld: 76 mg/dL (ref 65–99)
Potassium: 4.7 mmol/L (ref 3.5–5.3)
Sodium: 139 mmol/L (ref 135–146)
Total Bilirubin: 0.4 mg/dL (ref 0.2–1.2)
Total Protein: 7.3 g/dL (ref 6.1–8.1)
eGFR: 95 mL/min/{1.73_m2} (ref >=60)

## 2021-11-12 LAB — CBC WITH DIFFERENTIAL/PLATELET
Absolute Monocytes: 336 cells/uL (ref 200–950)
Basophils Absolute: 40 cells/uL (ref 0–200)
Basophils Relative: 1 %
Eosinophils Absolute: 200 cells/uL (ref 15–500)
Eosinophils Relative: 5 %
HCT: 36.5 % (ref 35.0–45.0)
Hemoglobin: 11.6 g/dL — ABNORMAL LOW (ref 11.7–15.5)
Lymphs Abs: 1556 cells/uL (ref 850–3900)
MCH: 27 pg (ref 27.0–33.0)
MCHC: 31.8 g/dL — ABNORMAL LOW (ref 32.0–36.0)
MCV: 84.9 fL (ref 80.0–100.0)
MPV: 10.4 fL (ref 7.5–12.5)
Monocytes Relative: 8.4 %
Neutro Abs: 1868 cells/uL (ref 1500–7800)
Neutrophils Relative %: 46.7 %
Platelets: 426 10*3/uL — ABNORMAL HIGH (ref 140–400)
RBC: 4.3 10*6/uL (ref 3.80–5.10)
RDW: 13.4 % (ref 11.0–15.0)
Total Lymphocyte: 38.9 %
WBC: 4 10*3/uL (ref 3.8–10.8)

## 2021-11-12 LAB — LIPID PANEL
Cholesterol: 200 mg/dL — ABNORMAL HIGH (ref <200)
HDL: 83 mg/dL (ref >=50)
LDL Cholesterol (Calc): 106 mg/dL (calc) — ABNORMAL HIGH
Non-HDL Cholesterol (Calc): 117 mg/dL (calc) (ref <130)
Total CHOL/HDL Ratio: 2.4 (calc) (ref <5.0)
Triglycerides: 38 mg/dL (ref <150)

## 2021-11-12 LAB — TSH: TSH: 0.6 mIU/L

## 2021-11-13 LAB — PAP IG (IMAGE GUIDED)

## 2021-12-04 ENCOUNTER — Encounter (INDEPENDENT_AMBULATORY_CARE_PROVIDER_SITE_OTHER): Payer: Self-pay

## 2022-05-07 ENCOUNTER — Ambulatory Visit: Payer: Commercial Managed Care - PPO | Admitting: Family

## 2022-05-08 ENCOUNTER — Ambulatory Visit (INDEPENDENT_AMBULATORY_CARE_PROVIDER_SITE_OTHER): Payer: Commercial Managed Care - PPO | Admitting: Family

## 2022-05-08 ENCOUNTER — Encounter: Payer: Self-pay | Admitting: Family

## 2022-05-08 VITALS — BP 124/86 | HR 75 | Temp 96.9°F | Resp 16 | Ht 68.0 in | Wt 216.0 lb

## 2022-05-08 DIAGNOSIS — E041 Nontoxic single thyroid nodule: Secondary | ICD-10-CM

## 2022-05-08 DIAGNOSIS — R5383 Other fatigue: Secondary | ICD-10-CM | POA: Diagnosis not present

## 2022-05-08 DIAGNOSIS — N943 Premenstrual tension syndrome: Secondary | ICD-10-CM | POA: Diagnosis not present

## 2022-05-08 DIAGNOSIS — E782 Mixed hyperlipidemia: Secondary | ICD-10-CM

## 2022-05-08 NOTE — Progress Notes (Signed)
Provider: Marlowe Sax FNP-C   Jalene Lacko, Nelda Bucks, NP  Patient Care Team: Antwone Capozzoli, Nelda Bucks, NP as PCP - General (Family Medicine)  Extended Emergency Contact Information Primary Emergency Contact: Dupee,Dolly Mobile Phone: [phone] Relation: Mother Interpreter needed? No Secondary Emergency Contact: Merkel Mobile Phone: [phone] Relation: Sister  Code Status:  Full Code  Goals of care: Advanced Directive information    05/08/2022    1:27 PM  Advanced Directives  Does Patient Have a Medical Advance Directive? No  Would patient like information on creating a medical advance directive? No - Patient declined     Chief Complaint  Patient presents with   Medical Management of Chronic Issues    6 month follow up.    Health Maintenance    Discuss the need for HIV Screening, and Hepatitis C Screening.   Immunizations    Discuss the need for [employer].    HPI:  Pt is a 47 y.o. female seen today for 67-monthfollow-up for medical management of chronic diseases.  Has medical history of essential hypertension, hypothyroidism, seasonal allergies right thyroid nodule post ultrasound and biopsy several years ago, obesity among other conditions. She complains of worsening premenstrual symptoms and fatigue.  Right thyroid nodule has also worsened in size. Denies any pain no issues with swallowing. She is due for hep C and HIV screening though states lab work was previously done with another provider would need to obtain records and then update chart.  Also due for COVID-19 vaccine aware to get vaccine at the pharmacy.   Past Medical History:  Diagnosis Date   Anxiety    Back pain    Borderline hypertension    Depression    Dysmenorrhea    Fibroid    uterine   Hyperlipidemia    Joint pain    Lower extremity edema    Ovarian cyst    Seasonal allergies    Thyroid nodule    Thyroid nodule    Past Surgical History:  Procedure Laterality Date    CERVICAL CERCLAGE      No Known Allergies  Allergies as of 05/08/2022   No Known Allergies      Medication List        Accurate as of May 08, 2022  1:44 PM. If you have any questions, ask your nurse or doctor.          STOP taking these medications    TRAZODONE HCL ER PO Stopped by: DSandrea Hughs NP   ZyrTEC Allergy 10 MG Caps Generic drug: Cetirizine HCl Stopped by: DSandrea Hughs NP       TAKE these medications    multivitamin with minerals tablet Take 1 tablet by mouth daily.        Review of Systems  Constitutional:  Positive for fatigue. Negative for appetite change, chills, fever and unexpected weight change.  HENT:  Negative for congestion, dental problem, ear discharge, ear pain, facial swelling, hearing loss, nosebleeds, postnasal drip, rhinorrhea, sinus pressure, sinus pain, sneezing, sore throat, tinnitus and trouble swallowing.   Eyes:  Negative for pain, discharge, redness, itching and visual disturbance.  Respiratory:  Negative for cough, chest tightness, shortness of breath and wheezing.   Cardiovascular:  Negative for chest pain, palpitations and leg swelling.  Gastrointestinal:  Negative for abdominal distention, abdominal pain, blood in stool, constipation, diarrhea, nausea and vomiting.  Endocrine: Negative for cold intolerance, heat intolerance, polydipsia, polyphagia and polyuria.  Genitourinary:  Positive for menstrual problem. Negative for  difficulty urinating, dysuria, flank pain, frequency and urgency.  Musculoskeletal:  Negative for arthralgias, back pain, gait problem, joint swelling, myalgias, neck pain and neck stiffness.  Skin:  Negative for color change, pallor, rash and wound.  Neurological:  Negative for dizziness, syncope, speech difficulty, weakness, light-headedness, numbness and headaches.  Hematological:  Does not bruise/bleed easily.  Psychiatric/Behavioral:  Negative for agitation, behavioral problems, confusion,  hallucinations, self-injury, sleep disturbance and suicidal ideas. The patient is not nervous/anxious.     Immunization History  Administered Date(s) Administered   Influenza-Unspecified 03/28/2016, 01/29/2022   PFIZER(Purple Top)SARS-COV-2 Vaccination 04/07/2020, 04/30/2020   PPD Test 11/25/2019, 11/16/2020, 11/26/2020   Tdap 04/28/2009, 11/24/2018   Pertinent  Health Maintenance Due  Topic Date Due   PAP SMEAR-Modifier  11/11/2024   INFLUENZA VACCINE  Completed      12/22/2019    1:23 AM 12/25/2019    2:37 PM 11/04/2021    8:24 AM 11/11/2021    8:29 AM 05/08/2022    1:26 PM  Fall Risk  Falls in the past year?   0 0 0  Was there an injury with Fall?   0 0 0  Fall Risk Category Calculator   0 0 0  Fall Risk Category   Low Low Low  Patient Fall Risk Level Low fall risk Low fall risk Low fall risk Low fall risk Low fall risk  Patient at Risk for Falls Due to   No Fall Risks No Fall Risks No Fall Risks  Fall risk Follow up   Falls evaluation completed Falls evaluation completed Falls evaluation completed   Functional Status Survey:    Vitals:   05/08/22 1328  BP: 124/86  Pulse: 75  Resp: 16  Temp: (!) 96.9 F (36.1 C)  SpO2: 99%  Weight: 216 lb (98 kg)  Height: '5\' 8"'$  (1.727 m)   Body mass index is 32.84 kg/m. Physical Exam Vitals reviewed.  Constitutional:      General: She is not in acute distress.    Appearance: Normal appearance. She is normal weight. She is not ill-appearing or diaphoretic.  HENT:     Head: Normocephalic.     Right Ear: Tympanic membrane, ear canal and external ear normal. There is no impacted cerumen.     Left Ear: Tympanic membrane, ear canal and external ear normal. There is no impacted cerumen.     Nose: Nose normal. No congestion or rhinorrhea.     Mouth/Throat:     Mouth: Mucous membranes are moist.     Pharynx: Oropharynx is clear. No oropharyngeal exudate or posterior oropharyngeal erythema.  Eyes:     General: No scleral icterus.        Right eye: No discharge.        Left eye: No discharge.     Extraocular Movements: Extraocular movements intact.     Conjunctiva/sclera: Conjunctivae normal.     Pupils: Pupils are equal, round, and reactive to light.  Neck:     Thyroid: Thyroid mass present. No thyroid tenderness.     Vascular: Decreased carotid pulses: right. No carotid bruit.     Comments: Thyroid mass  Cardiovascular:     Rate and Rhythm: Normal rate and regular rhythm.     Pulses: Normal pulses.     Heart sounds: Normal heart sounds. No murmur heard.    No friction rub. No gallop.  Pulmonary:     Effort: Pulmonary effort is normal. No respiratory distress.     Breath sounds: Normal breath  sounds. No wheezing, rhonchi or rales.  Chest:     Chest wall: No tenderness.  Abdominal:     General: Bowel sounds are normal. There is no distension.     Palpations: Abdomen is soft. There is no mass.     Tenderness: There is no abdominal tenderness. There is no right CVA tenderness, left CVA tenderness, guarding or rebound.  Musculoskeletal:        General: No swelling or tenderness. Normal range of motion.     Cervical back: Normal range of motion. No rigidity or tenderness.     Right lower leg: No edema.     Left lower leg: No edema.  Lymphadenopathy:     Cervical: No cervical adenopathy.  Skin:    General: Skin is warm and dry.     Coloration: Skin is not pale.     Findings: No bruising, erythema, lesion or rash.  Neurological:     Mental Status: She is alert and oriented to person, place, and time.     Cranial Nerves: No cranial nerve deficit.     Sensory: No sensory deficit.     Motor: No weakness.     Coordination: Coordination normal.     Gait: Gait normal.  Psychiatric:        Mood and Affect: Mood normal.        Speech: Speech normal.        Behavior: Behavior normal.        Thought Content: Thought content normal.        Judgment: Judgment normal.    Labs reviewed: Recent Labs    11/11/21 1013   NA 139  K 4.7  CL 104  CO2 27  GLUCOSE 76  BUN 14  CREATININE 0.79  CALCIUM 9.6   Recent Labs    11/11/21 1013  AST 22  ALT 14  BILITOT 0.4  PROT 7.3   Recent Labs    11/11/21 1013  WBC 4.0  NEUTROABS 1,868  HGB 11.6*  HCT 36.5  MCV 84.9  PLT 426*   Lab Results  Component Value Date   TSH 0.60 11/11/2021   Lab Results  Component Value Date   HGBA1C 5.2 10/27/2019   Lab Results  Component Value Date   CHOL 200 (H) 11/11/2021   HDL 83 11/11/2021   LDLCALC 106 (H) 11/11/2021   TRIG 38 11/11/2021   CHOLHDL 2.4 11/11/2021    Significant Diagnostic Results in last 30 days:  No results found.  Assessment/Plan  1. Thyroid nodule Right thyroid nodule has worsened in size. Will obtain ultrasound still awaiting endocrinologist appointment. - TSH; Future - CBC with Differential/Platelet; Future - US THYROID; Future  2. Fatigue, unspecified type Suspect possible due to the thyroid - TSH; Future - COMPLETE METABOLIC PANEL WITH GFR; Future - CBC with Differential/Platelet; Future  3. Mixed hyperlipidemia Previous LDL not at goal Dietary modification and exercise advised - Lipid panel; Future  4. PMS (premenstrual syndrome) Will refer to gynecologist - Ambulatory referral to Gynecology  Family/ staff Communication: Reviewed plan of care with patient verbalized understanding  Labs/tests ordered:  - CBC with Differential/Platelet - CMP with eGFR([facility]) - TSH - Hgb A1C - Lipid panel  Next Appointment : Return in about 6 months (around 11/06/2022) for medical mangement of chronic issues., fasting labs in one week or sooner .   Sandrea Hughs, NP

## 2022-05-09 ENCOUNTER — Other Ambulatory Visit: Payer: Commercial Managed Care - PPO

## 2022-05-12 ENCOUNTER — Other Ambulatory Visit: Payer: Commercial Managed Care - PPO

## 2022-05-12 DIAGNOSIS — E041 Nontoxic single thyroid nodule: Secondary | ICD-10-CM | POA: Diagnosis not present

## 2022-05-12 DIAGNOSIS — E782 Mixed hyperlipidemia: Secondary | ICD-10-CM | POA: Diagnosis not present

## 2022-05-12 DIAGNOSIS — R5383 Other fatigue: Secondary | ICD-10-CM | POA: Diagnosis not present

## 2022-05-13 LAB — CBC WITH DIFFERENTIAL/PLATELET
Absolute Monocytes: 378 cells/uL (ref 200–950)
Basophils Absolute: 29 cells/uL (ref 0–200)
Basophils Relative: 0.7 %
Eosinophils Absolute: 71 cells/uL (ref 15–500)
Eosinophils Relative: 1.7 %
HCT: 36.9 % (ref 35.0–45.0)
Hemoglobin: 12.5 g/dL (ref 11.7–15.5)
Lymphs Abs: 1701 cells/uL (ref 850–3900)
MCH: 30.4 pg (ref 27.0–33.0)
MCHC: 33.9 g/dL (ref 32.0–36.0)
MCV: 89.8 fL (ref 80.0–100.0)
MPV: 10.9 fL (ref 7.5–12.5)
Monocytes Relative: 9 %
Neutro Abs: 2020 cells/uL (ref 1500–7800)
Neutrophils Relative %: 48.1 %
Platelets: 300 10*3/uL (ref 140–400)
RBC: 4.11 10*6/uL (ref 3.80–5.10)
RDW: 13 % (ref 11.0–15.0)
Total Lymphocyte: 40.5 %
WBC: 4.2 10*3/uL (ref 3.8–10.8)

## 2022-05-13 LAB — COMPLETE METABOLIC PANEL WITH GFR
AG Ratio: 1.4 (calc) (ref 1.0–2.5)
ALT: 18 U/L (ref 6–29)
AST: 21 U/L (ref 10–30)
Albumin: 3.9 g/dL (ref 3.6–5.1)
Alkaline phosphatase (APISO): 50 U/L (ref 31–125)
BUN: 11 mg/dL (ref 7–25)
CO2: 27 mmol/L (ref 20–32)
Calcium: 9.5 mg/dL (ref 8.6–10.2)
Chloride: 105 mmol/L (ref 98–110)
Creat: 0.81 mg/dL (ref 0.50–0.99)
Globulin: 2.8 g/dL (calc) (ref 1.9–3.7)
Glucose, Bld: 80 mg/dL (ref 65–99)
Potassium: 4.4 mmol/L (ref 3.5–5.3)
Sodium: 138 mmol/L (ref 135–146)
Total Bilirubin: 0.6 mg/dL (ref 0.2–1.2)
Total Protein: 6.7 g/dL (ref 6.1–8.1)
eGFR: 92 mL/min/{1.73_m2} (ref >=60)

## 2022-05-13 LAB — LIPID PANEL
Cholesterol: 193 mg/dL (ref <200)
HDL: 86 mg/dL (ref >=50)
LDL Cholesterol (Calc): 95 mg/dL (calc)
Non-HDL Cholesterol (Calc): 107 mg/dL (calc) (ref <130)
Total CHOL/HDL Ratio: 2.2 (calc) (ref <5.0)
Triglycerides: 40 mg/dL (ref <150)

## 2022-05-13 LAB — TSH: TSH: 1.58 mIU/L

## 2022-05-15 ENCOUNTER — Other Ambulatory Visit: Payer: PRIVATE HEALTH INSURANCE

## 2022-06-10 DIAGNOSIS — F330 Major depressive disorder, recurrent, mild: Secondary | ICD-10-CM | POA: Diagnosis not present

## 2022-06-18 ENCOUNTER — Telehealth: Payer: Commercial Managed Care - PPO

## 2022-06-18 ENCOUNTER — Telehealth: Payer: Commercial Managed Care - PPO | Admitting: Nurse Practitioner

## 2022-06-18 ENCOUNTER — Encounter: Payer: Self-pay | Admitting: Nurse Practitioner

## 2022-06-18 DIAGNOSIS — H5213 Myopia, bilateral: Secondary | ICD-10-CM | POA: Diagnosis not present

## 2022-06-18 DIAGNOSIS — G4700 Insomnia, unspecified: Secondary | ICD-10-CM

## 2022-06-18 DIAGNOSIS — H52223 Regular astigmatism, bilateral: Secondary | ICD-10-CM | POA: Diagnosis not present

## 2022-06-18 DIAGNOSIS — H524 Presbyopia: Secondary | ICD-10-CM | POA: Diagnosis not present

## 2022-06-18 MED ORDER — HYDROXYZINE HCL 10 MG PO TABS: 10.0000 mg | ORAL_TABLET | Freq: Every evening | ORAL | 0 refills | Status: AC | PRN

## 2022-06-18 NOTE — Progress Notes (Signed)
Virtual Visit Consent   Anita Padilla, you are scheduled for a virtual visit with a [facility] provider today. Just as with appointments in the office, your consent must be obtained to participate. Your consent will be active for this visit and any virtual visit you may have with one of our providers in the next 365 days. If you have a MyChart account, a copy of this consent can be sent to you electronically.  As this is a virtual visit, video technology does not allow for your provider to perform a traditional examination. This may limit your provider's ability to fully assess your condition. If your provider identifies any concerns that need to be evaluated in person or the need to arrange testing (such as labs, EKG, etc.), we will make arrangements to do so. Although advances in technology are sophisticated, we cannot ensure that it will always work on either your end or our end. If the connection with a video visit is poor, the visit may have to be switched to a telephone visit. With either a video or telephone visit, we are not always able to ensure that we have a secure connection.  By engaging in this virtual visit, you consent to the provision of healthcare and authorize for your insurance to be billed (if applicable) for the services provided during this visit. Depending on your insurance coverage, you may receive a charge related to this service.  I need to obtain your verbal consent now. Are you willing to proceed with your visit today? Anita Padilla has provided verbal consent on 06/18/2022 for a virtual visit (video or telephone). Apolonio Schneiders, FNP  Date: 06/18/2022 5:14 PM  Virtual Visit via Video Note   I, Apolonio Schneiders, connected with  Anita Padilla  ([id number], 1975/08/06) on 06/18/22 at  5:30 PM EST by a video-enabled telemedicine application and verified that I am speaking with the correct person using two identifiers.  Location: Patient: Virtual Visit Location Patient:  Home Provider: Virtual Visit Location Provider: Home Office   I discussed the limitations of evaluation and management by telemedicine and the availability of in person appointments. The patient expressed understanding and agreed to proceed.    History of Present Illness: Anita Padilla is a 47 y.o. who identifies as a female who was assigned female at birth, and is being seen today for ongoing sleep concerns.  She has been under some stress and anxiety at work that has been causing her difficulty with sleeping  She has been waking up at 2 am every night and has been having a difficult time going back to sleep.   She typically requires 7-8 hours in order to feel herself   She has started a Magnesium supplement to help and this does help her fall asleep but not stay asleep   She has been trying to go to sleep at 8-9pm   She has used medications in the past for sleep  Trazadone and Vistaril as well 2-3 years ago. She used this short term and did have good outcomes   She is awaiting an endocrinology appointment to address her thyroid, that will not be until May    She is familiar with sleep hygiene routines   Problems:  Patient Active Problem List   Diagnosis Date Noted   Primary insomnia 11/04/2021   Seasonal allergies 11/04/2021   Mixed hyperlipidemia 11/04/2021   Fatigue 11/04/2021   Morbid obesity ([place]) 11/04/2021   Thyroid nodule    Pain in pelvis 10/09/2017  Uterine fibroid 10/09/2017   Ovarian cyst 10/08/2017   Weight gain 04/10/2017   Hashimoto's disease 08/07/2016   Dysmenorrhea 07/02/2016    Allergies: No Known Allergies Medications:  Current Outpatient Medications:    Multiple Vitamins-Minerals (MULTIVITAMIN WITH MINERALS) tablet, Take 1 tablet by mouth daily., Disp: , Rfl:   Observations/Objective: Patient is well-developed, well-nourished in no acute distress.  Resting comfortably  at home.  Head is normocephalic, atraumatic.  No labored breathing.   Speech is clear and coherent with logical content.  Patient is alert and oriented at baseline.    Assessment and Plan: 1. Insomnia, unspecified type  - hydrOXYzine (ATARAX) 10 MG tablet; Take 1 tablet (10 mg total) by mouth at bedtime as needed for anxiety (insomnia). May increase to 34m as needed for sleep (anxiety)  Dispense: 30 tablet; Refill: 0     Follow Up Instructions: I discussed the assessment and treatment plan with the patient. The patient was provided an opportunity to ask questions and all were answered. The patient agreed with the plan and demonstrated an understanding of the instructions.  A copy of instructions were sent to the patient via MyChart unless otherwise noted below.    The patient was advised to call back or seek an in-person evaluation if the symptoms worsen or if the condition fails to improve as anticipated.  Time:  I spent 15 minutes with the patient via telehealth technology discussing the above problems/concerns.    SApolonio Schneiders FNP

## 2022-06-20 DIAGNOSIS — F330 Major depressive disorder, recurrent, mild: Secondary | ICD-10-CM | POA: Diagnosis not present

## 2022-06-27 DIAGNOSIS — F331 Major depressive disorder, recurrent, moderate: Secondary | ICD-10-CM | POA: Diagnosis not present

## 2022-07-06 DIAGNOSIS — F331 Major depressive disorder, recurrent, moderate: Secondary | ICD-10-CM | POA: Diagnosis not present

## 2022-07-10 ENCOUNTER — Other Ambulatory Visit: Payer: Self-pay | Admitting: Nurse Practitioner

## 2022-07-10 DIAGNOSIS — G4700 Insomnia, unspecified: Secondary | ICD-10-CM

## 2022-07-13 DIAGNOSIS — F331 Major depressive disorder, recurrent, moderate: Secondary | ICD-10-CM | POA: Diagnosis not present

## 2022-07-15 ENCOUNTER — Ambulatory Visit (INDEPENDENT_AMBULATORY_CARE_PROVIDER_SITE_OTHER): Payer: Commercial Managed Care - PPO | Admitting: Family

## 2022-07-15 ENCOUNTER — Encounter: Payer: Self-pay | Admitting: Family

## 2022-07-15 VITALS — BP 120/80 | HR 93 | Temp 98.0°F | Resp 18 | Ht 68.0 in | Wt 205.5 lb

## 2022-07-15 DIAGNOSIS — Z111 Encounter for screening for respiratory tuberculosis: Secondary | ICD-10-CM

## 2022-07-15 DIAGNOSIS — Z0289 Encounter for other administrative examinations: Secondary | ICD-10-CM

## 2022-07-15 NOTE — Progress Notes (Signed)
Provider: Marlowe Sax FNP-C  Domenique Southers, Nelda Bucks, NP  Patient Care Team: Kalynne Womac, Nelda Bucks, NP as PCP - General (Family Medicine)  Extended Emergency Contact Information Primary Emergency Contact: Brockel,Dolly Mobile Phone: [phone] Relation: Mother Interpreter needed? No Secondary Emergency Contact: Burke Mobile Phone: [phone] Relation: Sister  Code Status:  Full Code  Goals of care: Advanced Directive information    05/08/2022    1:27 PM  Advanced Directives  Does Patient Have a Medical Advance Directive? No  Would patient like information on creating a medical advance directive? No - Patient declined     Chief Complaint  Patient presents with   Medical Management of Chronic Issues    Medical Clearance and needs a two step PPD   Quality Metric Gaps    Needs to discuss HIV and Hepatitis C Screening, Covid vaccine    HPI:  Pt is a 47 y.o. 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  Has completed HIV and Hep C screening prior to coming to this practice will obtain records.    Past Medical History:  Diagnosis Date   Anxiety    Back pain    Borderline hypertension    Depression    Dysmenorrhea    Fibroid    uterine   Hyperlipidemia    Joint pain    Lower extremity edema    Ovarian cyst    Seasonal allergies    Thyroid nodule    Thyroid nodule    Past Surgical History:  Procedure Laterality Date   CERVICAL CERCLAGE      No Known Allergies  Outpatient Encounter Medications as of 07/15/2022  Medication Sig   hydrOXYzine (ATARAX) 10 MG tablet Take 1 tablet (10 mg total) by mouth at bedtime as needed for anxiety (insomnia). May increase to 20mg  as needed for sleep (anxiety)   Multiple Vitamins-Minerals (MULTIVITAMIN WITH MINERALS) tablet Take 1 tablet by mouth daily.   [DISCONTINUED]  norgestimate-ethinyl estradiol (SPRINTEC 28) 0.25-35 MG-MCG tablet Take 1 tablet by mouth every evening. (Patient not taking: Reported on 06/07/2019)   [DISCONTINUED] promethazine (PHENERGAN) 12.5 MG tablet Take 1 tablet (12.5 mg total) by mouth every 6 (six) hours as needed for nausea or vomiting. (Patient not taking: Reported on 06/07/2019)   No facility-administered encounter medications on file as of 07/15/2022.    Review of Systems  Constitutional:  Negative for appetite change, chills, fatigue, fever and unexpected weight change.  HENT:  Negative for congestion, dental problem, ear discharge, ear pain, facial swelling, hearing loss, nosebleeds, postnasal drip, rhinorrhea, sinus pressure, sinus pain, sneezing, sore throat, tinnitus and trouble swallowing.   Eyes:  Positive for visual disturbance. Negative for pain, discharge, redness and itching.       Wears eye glasses   Respiratory:  Negative for cough, chest tightness, shortness of breath and wheezing.   Cardiovascular:  Negative for chest pain, palpitations and leg swelling.  Gastrointestinal:  Negative for abdominal distention, abdominal pain, blood in stool, constipation, diarrhea, nausea and vomiting.  Endocrine: Negative for cold intolerance, heat intolerance, polydipsia, polyphagia and polyuria.  Genitourinary:  Negative for difficulty urinating, dysuria, flank pain, frequency and urgency.  Musculoskeletal:  Negative for arthralgias, back pain, gait problem, joint swelling, myalgias, neck pain and neck stiffness.  Skin:  Negative for color change, pallor, rash and wound.  Neurological:  Negative for dizziness, syncope, speech  difficulty, weakness, light-headedness, numbness and headaches.  Hematological:  Does not bruise/bleed easily.  Psychiatric/Behavioral:  Negative for agitation, behavioral problems, confusion, hallucinations, self-injury, sleep disturbance and suicidal ideas. The patient is not nervous/anxious.     Immunization  History  Administered Date(s) Administered   Influenza-Unspecified 03/28/2016, 01/29/2022   PFIZER(Purple Top)SARS-COV-2 Vaccination 04/07/2020, 04/30/2020   PPD Test 11/25/2019, 11/16/2020, 11/26/2020   Tdap 04/28/2009, 11/24/2018   Pertinent  Health Maintenance Due  Topic Date Due   PAP SMEAR-Modifier  11/11/2024   INFLUENZA VACCINE  Completed      12/25/2019    2:37 PM 11/04/2021    8:24 AM 11/11/2021    8:29 AM 05/08/2022    1:26 PM 07/15/2022   10:30 AM  Fall Risk  Falls in the past year?  0 0 0 0  Was there an injury with Fall?  0 0 0 0  Fall Risk Category Calculator  0 0 0 0  Fall Risk Category (Retired)  Low Low Low   (RETIRED) Patient Fall Risk Level Low fall risk Low fall risk Low fall risk Low fall risk   Patient at Risk for Falls Due to  No Fall Risks No Fall Risks No Fall Risks No Fall Risks  Fall risk Follow up  Falls evaluation completed Falls evaluation completed Falls evaluation completed Falls evaluation completed   Functional Status Survey:    Vitals:   07/15/22 1023  BP: 120/80  Pulse: 93  Resp: 18  Temp: 98 F (36.7 C)  SpO2: 97%  Weight: 205 lb 8 oz (93.2 kg)  Height: 5\' 8"  (1.727 m)   Body mass index is 31.25 kg/m. Physical Exam Vitals reviewed.  Constitutional:      General: She is not in acute distress.    Appearance: Normal appearance. She is obese. She is not ill-appearing or diaphoretic.  HENT:     Head: Normocephalic.     Right Ear: Tympanic membrane, ear canal and external ear normal. There is no impacted cerumen.     Left Ear: Tympanic membrane, ear canal and external ear normal. There is no impacted cerumen.     Nose: Nose normal. No congestion or rhinorrhea.     Mouth/Throat:     Mouth: Mucous membranes are moist.     Pharynx: Oropharynx is clear. No oropharyngeal exudate or posterior oropharyngeal erythema.  Eyes:     General: No scleral icterus.       Right eye: No discharge.        Left eye: No discharge.     Extraocular  Movements: Extraocular movements intact.     Conjunctiva/sclera: Conjunctivae normal.     Pupils: Pupils are equal, round, and reactive to light.  Neck:     Vascular: No carotid bruit.  Cardiovascular:     Rate and Rhythm: Normal rate and regular rhythm.     Pulses: Normal pulses.     Heart sounds: Normal heart sounds. No murmur heard.    No friction rub. No gallop.  Pulmonary:     Effort: Pulmonary effort is normal. No respiratory distress.     Breath sounds: Normal breath sounds. No wheezing, rhonchi or rales.  Chest:     Chest wall: No tenderness.  Abdominal:     General: Bowel sounds are normal. There is no distension.     Palpations: Abdomen is soft. There is no mass.     Tenderness: There is no abdominal tenderness. There is no right CVA tenderness, left CVA tenderness, guarding or  rebound.  Musculoskeletal:        General: No swelling or tenderness. Normal range of motion.     Cervical back: Normal range of motion. No rigidity or tenderness.     Right lower leg: No edema.     Left lower leg: No edema.  Lymphadenopathy:     Cervical: No cervical adenopathy.  Skin:    General: Skin is warm and dry.     Coloration: Skin is not pale.     Findings: No bruising, erythema, lesion or rash.  Neurological:     Mental Status: She is alert and oriented to person, place, and time.     Cranial Nerves: No cranial nerve deficit.     Sensory: No sensory deficit.     Motor: No weakness.     Coordination: Coordination normal.     Gait: Gait normal.  Psychiatric:        Mood and Affect: Mood normal.        Speech: Speech normal.        Behavior: Behavior normal.        Thought Content: Thought content normal.        Judgment: Judgment normal.    Labs reviewed: Recent Labs    11/11/21 1013 05/12/22 0809  NA 139 138  K 4.7 4.4  CL 104 105  CO2 27 27  GLUCOSE 76 80  BUN 14 11  CREATININE 0.79 0.81  CALCIUM 9.6 9.5   Recent Labs    11/11/21 1013 05/12/22 0809  AST 22 21   ALT 14 18  BILITOT 0.4 0.6  PROT 7.3 6.7   Recent Labs    11/11/21 1013 05/12/22 0809  WBC 4.0 4.2  NEUTROABS 1,868 2,020  HGB 11.6* 12.5  HCT 36.5 36.9  MCV 84.9 89.8  PLT 426* 300   Lab Results  Component Value Date   TSH 1.58 05/12/2022   Lab Results  Component Value Date   HGBA1C 5.2 10/27/2019   Lab Results  Component Value Date   CHOL 193 05/12/2022   HDL 86 05/12/2022   LDLCALC 95 05/12/2022   TRIG 40 05/12/2022   CHOLHDL 2.2 05/12/2022    Significant Diagnostic Results in last 30 days:  No results found.  Assessment/Plan 1. Encounter for completion of form with patient work Form at [employer] of state operated Healthcare facilities.forms completed during the visit. Awaiting QuantiFeron test results then to be faxed or picked up by patient.  2. Screening-pulmonary TB Ordering QuantiFeron -TB gold plus test. - QuantiFERON-TB Gold Plus  Family/ staff Communication: Reviewed plan of care with patient verbalized understanding   Labs/tests ordered:  - QuantiFERON-TB Gold Plus  Next Appointment: Return if symptoms worsen or fail to improve.   Sandrea Hughs, NP

## 2022-07-17 LAB — QUANTIFERON-TB GOLD PLUS
Mitogen-NIL: 7.85 IU/mL
NIL: 0.02 IU/mL
QuantiFERON-TB Gold Plus: NEGATIVE
TB1-NIL: 0 IU/mL
TB2-NIL: 0.01 IU/mL

## 2022-07-20 DIAGNOSIS — F331 Major depressive disorder, recurrent, moderate: Secondary | ICD-10-CM | POA: Diagnosis not present

## 2022-07-28 DIAGNOSIS — F331 Major depressive disorder, recurrent, moderate: Secondary | ICD-10-CM | POA: Diagnosis not present

## 2022-08-05 DIAGNOSIS — L700 Acne vulgaris: Secondary | ICD-10-CM | POA: Diagnosis not present

## 2022-08-07 DIAGNOSIS — F331 Major depressive disorder, recurrent, moderate: Secondary | ICD-10-CM | POA: Diagnosis not present

## 2022-08-07 DIAGNOSIS — L700 Acne vulgaris: Secondary | ICD-10-CM | POA: Diagnosis not present

## 2022-08-07 LAB — HEPATIC FUNCTION PANEL
ALT: 16 U/L (ref 7–35)
AST: 16 (ref 13–35)
Alkaline Phosphatase: 59 (ref 25–125)
Bilirubin, Total: 0.5

## 2022-08-07 LAB — LIPID PANEL
Cholesterol: 192 (ref 0–200)
HDL: 92 — AB (ref 35–70)
LDL Cholesterol: 92
Triglycerides: 42 (ref 40–160)

## 2022-08-07 LAB — COMPREHENSIVE METABOLIC PANEL: Albumin: 4.2 (ref 3.5–5.0)

## 2022-08-08 ENCOUNTER — Encounter: Payer: Self-pay | Admitting: Family

## 2022-08-13 DIAGNOSIS — F331 Major depressive disorder, recurrent, moderate: Secondary | ICD-10-CM | POA: Diagnosis not present

## 2022-09-04 DIAGNOSIS — L700 Acne vulgaris: Secondary | ICD-10-CM | POA: Diagnosis not present

## 2022-09-08 ENCOUNTER — Ambulatory Visit: Payer: Commercial Managed Care - PPO | Admitting: Internal Medicine

## 2022-09-08 ENCOUNTER — Encounter: Payer: Self-pay | Admitting: Internal Medicine

## 2022-09-08 VITALS — BP 120/70 | HR 72 | Ht 68.0 in | Wt 204.0 lb

## 2022-09-08 DIAGNOSIS — E041 Nontoxic single thyroid nodule: Secondary | ICD-10-CM | POA: Diagnosis not present

## 2022-09-08 NOTE — Progress Notes (Signed)
Name: Anita Padilla  MRN/ DOB: [id number], 03-04-1976    Age/ Sex: 47 y.o., female    PCP: Ngetich, Donalee Citrin, NP   Reason for Endocrinology Evaluation: Thyroid Nodule      Date of Initial Endocrinology Evaluation: 09/08/2022     HPI: Anita Padilla is a 47 y.o. female with a past medical history of thyroid nodule . The patient presented for initial endocrinology clinic visit on 09/08/2022 for consultative assistance with her Thyroid nodule .     Patient was diagnosed with a right thyroid nodule in 2010.  Per patient she is s/p benign FNA at the time(records not available)  She had thyroid ultrasound in 2018 through [facility] with 4.8 cm right nodule at the superior pole, which was also biopsied on 08/07/2016.  Per patient, cytology report was benign (no records available for cytology report)  The patient does endorse noticeable local neck swelling, that she attributes to intentional weight loss of 50 LB's over the past year Denies neck pain  Denies dysphagia , but has noted occasional voice hoarseness mid sentence, relieved by clearing   No XRT  Denies palpitations  Denies tremors  Denies loose stools or diarrhea   Mother with hypothyroidism Paternal aunt with hyperthyroidism      HISTORY:  Past Medical History:  Past Medical History:  Diagnosis Date   Anxiety    Back pain    Borderline hypertension    Depression    Dysmenorrhea    Fibroid    uterine   Hyperlipidemia    Joint pain    Lower extremity edema    Ovarian cyst    Seasonal allergies    Thyroid nodule    Thyroid nodule    Past Surgical History:  Past Surgical History:  Procedure Laterality Date   CERVICAL CERCLAGE      Social History:  reports that she has never smoked. She has never used smokeless tobacco. She reports current alcohol use. She reports that she does not currently use drugs. Family History: family history includes Anxiety disorder in her mother; Depression in her mother; Hypertension  in her mother; Thyroid disease in her mother.   HOME MEDICATIONS: Allergies as of 09/08/2022   No Known Allergies      Medication List        Accurate as of Sep 08, 2022  2:27 PM. If you have any questions, ask your nurse or doctor.          hydrOXYzine 10 MG tablet Commonly known as: ATARAX Take 1 tablet (10 mg total) by mouth at bedtime as needed for anxiety (insomnia). May increase to 20mg  as needed for sleep (anxiety)   ISOtretinoin 40 MG capsule Commonly known as: ACCUTANE Take 40 mg by mouth daily.   multivitamin with minerals tablet Take 1 tablet by mouth daily.          REVIEW OF SYSTEMS: A comprehensive ROS was conducted with the patient and is negative except as per HPI    OBJECTIVE:  VS: BP 120/70 (BP Location: Left Arm, Patient Position: Sitting, Cuff Size: Large)   Pulse 72   Ht 5\' 8"  (1.727 m)   Wt 204 lb (92.5 kg)   SpO2 99%   BMI 31.02 kg/m    Wt Readings from Last 3 Encounters:  09/08/22 204 lb (92.5 kg)  07/15/22 205 lb 8 oz (93.2 kg)  05/08/22 216 lb (98 kg)     EXAM: General: Pt appears well and is in NAD  Eyes: External eye exam normal without stare, lid lag or exophthalmos.  EOM intact.    Neck: General: Supple without adenopathy. Thyroid: Right thyroid nodule~3 cm appreciated. No thyroid bruit.  Lungs: Clear with good BS bilat with no rales, rhonchi, or wheezes  Heart: Auscultation: RRR.  Abdomen: soft, nontender  Extremities:  BL LE: No pretibial edema.  Mental Status: Judgment, insight: Intact Orientation: Oriented to time, place, and person Mood and affect: No depression, anxiety, or agitation     DATA REVIEWED:  Latest Reference Range & Units 05/12/22 08:09  TSH mIU/L 1.58    Old records , labs and images have been reviewed.    ASSESSMENT/PLAN/RECOMMENDATIONS:   Thyroid Nodule :  -Patient with no specific local symptoms -TFTs have been normal -She has had benign FNA in the past, last one was in 2018,  cytology report are not available. -Will proceed with repeat ultrasound  Follow-up in 1 year  Signed electronically by: Lyndle Herrlich, MD  [facility] Endocrinology  [facility] [address] [place] [place], [place] [address] Phone: [phone] FAX: [phone]   CC: Caesar Bookman, NP [address] [place] [address] Phone: [phone] Fax: [phone]   Return to Endocrinology clinic as below: Future Appointments  Date Time Provider Department Center  11/06/2022  3:20 PM Ngetich, Donalee Citrin, NP [facility] None

## 2022-09-18 DIAGNOSIS — F331 Major depressive disorder, recurrent, moderate: Secondary | ICD-10-CM | POA: Diagnosis not present

## 2022-09-23 ENCOUNTER — Ambulatory Visit: Payer: Commercial Managed Care - PPO | Admitting: Family

## 2022-09-30 ENCOUNTER — Encounter: Payer: Self-pay | Admitting: Family

## 2022-09-30 ENCOUNTER — Ambulatory Visit (INDEPENDENT_AMBULATORY_CARE_PROVIDER_SITE_OTHER): Payer: Commercial Managed Care - PPO | Admitting: Family

## 2022-09-30 ENCOUNTER — Ambulatory Visit
Admission: RE | Admit: 2022-09-30 | Discharge: 2022-09-30 | Disposition: A | Discharge status: Home / Self Care | Payer: Commercial Managed Care - PPO | Source: Ambulatory Visit | Attending: Family | Admitting: Family

## 2022-09-30 VITALS — BP 122/70 | HR 83 | Ht 68.0 in | Wt 204.8 lb

## 2022-09-30 DIAGNOSIS — M545 Low back pain, unspecified: Secondary | ICD-10-CM | POA: Diagnosis not present

## 2022-09-30 DIAGNOSIS — D259 Leiomyoma of uterus, unspecified: Secondary | ICD-10-CM | POA: Diagnosis not present

## 2022-09-30 DIAGNOSIS — F419 Anxiety disorder, unspecified: Secondary | ICD-10-CM

## 2022-09-30 DIAGNOSIS — F32A Depression, unspecified: Secondary | ICD-10-CM

## 2022-09-30 DIAGNOSIS — R5383 Other fatigue: Secondary | ICD-10-CM | POA: Diagnosis not present

## 2022-09-30 DIAGNOSIS — N83202 Unspecified ovarian cyst, left side: Secondary | ICD-10-CM | POA: Diagnosis not present

## 2022-09-30 DIAGNOSIS — R1900 Intra-abdominal and pelvic swelling, mass and lump, unspecified site: Secondary | ICD-10-CM

## 2022-09-30 NOTE — Patient Instructions (Signed)
-   Notify provider or go to ED if symptoms worsen or fail to improve  

## 2022-09-30 NOTE — Progress Notes (Signed)
Provider: Richarda Blade FNP-C  Anita Padilla, Anita Citrin, NP  Patient Care Team: Marvens Hollars, Anita Citrin, NP as PCP - General (Family Medicine)  Extended Emergency Contact Information Primary Emergency Contact: Conley,Dolly Mobile Phone: [phone] Relation: Mother Interpreter needed? No Secondary Emergency Contact: CLAYTON,ANGELA Mobile Phone: [phone] Relation: Sister  Code Status:  Full Code  Goals of care: Advanced Directive information    05/08/2022    1:27 PM  Advanced Directives  Does Patient Have a Medical Advance Directive? No  Would patient like information on creating a medical advance directive? No - Patient declined     No chief complaint on file.   HPI:  Pt is a 47 y.o. female seen today for an acute visit for evaluation of  Feeling tired and exhausted Fe Iels [place] hadepression and anxietys been reviewed er ph, but I do see a therapist regularly and have just been trying to different coping skills okay some days are better than others within there are some days where to start.  I am still working osteoporosis General still exercising still trying to manage malnutrition but [place]  Right now is stressful and the emotional stress, states being off plan to start.  craPast weekend r started having issues with lower back where it actually felt like nerve pain because it was sharp shooting every so ving thinking possibly could be inflammation but has been resting including ice pack alternating ice pack and heating pad that intake 6 hours and that is giving me some relief but the pain is still very stiff although toenails sharp so there is this cardiograms ultrasound thyroid results eceived from [facility] and so that takes to because just recently  Past Medical History:  Diagnosis Date  . Anxiety   . Back pain   . Borderline hypertension   . Depression   . Dysmenorrhea   . Fibroid    uterine  . Hyperlipidemia   . Joint pain   . Lower extremity edema   . Ovarian cyst    . Seasonal allergies   . Thyroid nodule   . Thyroid nodule    Past Surgical History:  Procedure Laterality Date  . CERVICAL CERCLAGE      No Known Allergies  Outpatient Encounter Medications as of 09/30/2022  Medication Sig  . DHEA 25 MG CAPS Take 25 mg by mouth daily.  . ISOtretinoin (ACCUTANE) 40 MG capsule Take 40 mg by mouth daily.  . Multiple Vitamins-Minerals (MULTIVITAMIN WITH MINERALS) tablet Take 1 tablet by mouth daily.  . progesterone (PROMETRIUM) 200 MG capsule Take 200 mg by mouth daily.  . [DISCONTINUED] norgestimate-ethinyl estradiol (SPRINTEC 28) 0.25-35 MG-MCG tablet Take 1 tablet by mouth every evening. (Patient not taking: Reported on 06/07/2019)  . [DISCONTINUED] promethazine (PHENERGAN) 12.5 MG tablet Take 1 tablet (12.5 mg total) by mouth every 6 (six) hours as needed for nausea or vomiting. (Patient not taking: Reported on 06/07/2019)   No facility-administered encounter medications on file as of 09/30/2022.    Review of Systems  Immunization History  Administered Date(s) Administered  . Influenza-Unspecified 03/28/2016, 01/29/2022  . PFIZER(Purple Top)SARS-COV-2 Vaccination 04/07/2020, 04/30/2020  . PPD Test 11/25/2019, 11/16/2020, 11/26/2020  . Tdap 04/28/2009, 11/24/2018   Pertinent  Health Maintenance Due  Topic Date Due  . INFLUENZA VACCINE  11/27/2022  . PAP SMEAR-Modifier  11/11/2024      12/25/2019    2:37 PM 11/04/2021    8:24 AM 11/11/2021    8:29 AM 05/08/2022    1:26 PM 07/15/2022  10:30 AM  Fall Risk  Falls in the past year?  0 0 0 0  Was there an injury with Fall?  0 0 0 0  Fall Risk Category Calculator  0 0 0 0  Fall Risk Category (Retired)  Low Low Low   (RETIRED) Patient Fall Risk Level Low fall risk Low fall risk Low fall risk Low fall risk   Patient at Risk for Falls Due to  No Fall Risks No Fall Risks No Fall Risks No Fall Risks  Fall risk Follow up  Falls evaluation completed Falls evaluation completed Falls evaluation completed  Falls evaluation completed   Functional Status Survey:    Vitals:   09/30/22 1104  BP: 122/70  Pulse: 83  SpO2: 99%  Weight: 204 lb 12.8 oz (92.9 kg)  Height: 5\' 8"  (1.727 m)   Body mass index is 31.14 kg/m. Physical Exam  Labs reviewed: Recent Labs    11/11/21 1013 05/12/22 0809  NA 139 138  K 4.7 4.4  CL 104 105  CO2 27 27  GLUCOSE 76 80  BUN 14 11  CREATININE 0.79 0.81  CALCIUM 9.6 9.5   Recent Labs    11/11/21 1013 05/12/22 0809 08/07/22 0000  AST 22 21 16   ALT 14 18 16   ALKPHOS  --   --  59  BILITOT 0.4 0.6  --   PROT 7.3 6.7  --   ALBUMIN  --   --  4.2   Recent Labs    11/11/21 1013 05/12/22 0809  WBC 4.0 4.2  NEUTROABS 1,868 2,020  HGB 11.6* 12.5  HCT 36.5 36.9  MCV 84.9 89.8  PLT 426* 300   Lab Results  Component Value Date   TSH 1.58 05/12/2022   Lab Results  Component Value Date   HGBA1C 5.2 10/27/2019   Lab Results  Component Value Date   CHOL 192 08/07/2022   HDL 92 (A) 08/07/2022   LDLCALC 92 08/07/2022   TRIG 42 08/07/2022   CHOLHDL 2.2 05/12/2022    Significant Diagnostic Results in last 30 days:  No results found.  Assessment/Plan There are no diagnoses linked to this encounter.   Family/ staff Communication: Reviewed plan of care with patient  Labs/tests ordered: None   Next Appointment:   Caesar Bookman, NP

## 2022-10-01 ENCOUNTER — Other Ambulatory Visit: Payer: Self-pay | Admitting: Family

## 2022-10-01 DIAGNOSIS — R1900 Intra-abdominal and pelvic swelling, mass and lump, unspecified site: Secondary | ICD-10-CM

## 2022-10-01 DIAGNOSIS — M545 Low back pain, unspecified: Secondary | ICD-10-CM

## 2022-10-01 DIAGNOSIS — R5383 Other fatigue: Secondary | ICD-10-CM

## 2022-10-01 DIAGNOSIS — F32A Depression, unspecified: Secondary | ICD-10-CM | POA: Insufficient documentation

## 2022-10-03 ENCOUNTER — Telehealth (INDEPENDENT_AMBULATORY_CARE_PROVIDER_SITE_OTHER): Payer: Commercial Managed Care - PPO | Admitting: Family

## 2022-10-03 ENCOUNTER — Ambulatory Visit
Admission: RE | Admit: 2022-10-03 | Discharge: 2022-10-03 | Disposition: A | Discharge status: Home / Self Care | Payer: Commercial Managed Care - PPO | Source: Ambulatory Visit | Attending: Internal Medicine | Admitting: Internal Medicine

## 2022-10-03 ENCOUNTER — Encounter: Payer: Self-pay | Admitting: Family

## 2022-10-03 DIAGNOSIS — D259 Leiomyoma of uterus, unspecified: Secondary | ICD-10-CM

## 2022-10-03 DIAGNOSIS — E041 Nontoxic single thyroid nodule: Secondary | ICD-10-CM

## 2022-10-03 DIAGNOSIS — N83202 Unspecified ovarian cyst, left side: Secondary | ICD-10-CM

## 2022-10-03 DIAGNOSIS — E049 Nontoxic goiter, unspecified: Secondary | ICD-10-CM | POA: Diagnosis not present

## 2022-10-03 NOTE — Progress Notes (Signed)
This service is provided via telemedicine  No vital signs collected/recorded due to the encounter was a telemedicine visit.   Location of patient (ex: home, work):  Work  Patient consents to a telephone visit:    Location of the provider (ex: office, home):  Office  Name of any referring provider:  Esa Raden C, NP   Names of all persons participating in the telemedicine service and their role in the encounter:  Anita Padilla; Judithann Sauger McClurkin,CMA; Jailin Moomaw,NP  Time spent on call:  8 minutes     [name], Donalee Citrin, NP  Patient Care Team: Nitza Schmid, Donalee Citrin, NP as PCP - General (Family Medicine)  Extended Emergency Contact Information Primary Emergency Contact: Walz,Dolly Mobile Phone: [phone] Relation: Mother Interpreter needed? No Secondary Emergency Contact: CLAYTON,ANGELA Mobile Phone: [phone] Relation: Sister  Code Status:  Full Code  Goals of care: Advanced Directive information    05/08/2022    1:27 PM  Advanced Directives  Does Patient Have a Medical Advance Directive? No  Would patient like information on creating a medical advance directive? No - Patient declined     Chief Complaint  Patient presents with   Acute Visit    Patient presents otday via mychart to discuss ultrasound results    HPI:  Pt is a 47 y.o. female seen today for an acute visit to discuss transabdominal ultrasound done 09/30/2022 due to palpable large lower abdominal mass. Ultrasound showed severely enlarged fibroid uterus and 3 cm left ovarian cyst.  Results discussed with patient option given for referral to General surgery or gynecology for further evaluation.states does not want any surgery would like referral to gynecologist.  She denies any abdominal pain or bleeding.    Past Medical History:  Diagnosis Date   Anxiety    Back pain    Borderline hypertension    Depression    Dysmenorrhea    Fibroid    uterine   Hyperlipidemia    Joint pain    Lower  extremity edema    Ovarian cyst    Seasonal allergies    Thyroid nodule    Thyroid nodule    Past Surgical History:  Procedure Laterality Date   CERVICAL CERCLAGE      No Known Allergies  Outpatient Encounter Medications as of 10/03/2022  Medication Sig   DHEA 25 MG CAPS Take 25 mg by mouth daily.   ISOtretinoin (ACCUTANE) 40 MG capsule Take 40 mg by mouth daily.   Multiple Vitamins-Minerals (MULTIVITAMIN WITH MINERALS) tablet Take 1 tablet by mouth daily.   progesterone (PROMETRIUM) 200 MG capsule Take 200 mg by mouth daily.   [DISCONTINUED] norgestimate-ethinyl estradiol (SPRINTEC 28) 0.25-35 MG-MCG tablet Take 1 tablet by mouth every evening. (Patient not taking: Reported on 06/07/2019)   [DISCONTINUED] promethazine (PHENERGAN) 12.5 MG tablet Take 1 tablet (12.5 mg total) by mouth every 6 (six) hours as needed for nausea or vomiting. (Patient not taking: Reported on 06/07/2019)   No facility-administered encounter medications on file as of 10/03/2022.    Review of Systems  Constitutional:  Negative for appetite change, chills, fatigue, fever and unexpected weight change.  Respiratory:  Negative for cough, chest tightness, shortness of breath and wheezing.   Cardiovascular:  Negative for chest pain, palpitations and leg swelling.  Gastrointestinal:  Positive for abdominal distention. Negative for abdominal pain, nausea and vomiting.  Genitourinary:  Negative for flank pain, menstrual problem, pelvic pain, urgency and vaginal bleeding.  Skin:  Negative for color change, pallor and rash.  Neurological:  Negative for dizziness, light-headedness and headaches.    Immunization History  Administered Date(s) Administered   Influenza-Unspecified 03/28/2016, 01/29/2022   PFIZER(Purple Top)SARS-COV-2 Vaccination 04/07/2020, 04/30/2020   PPD Test 11/25/2019, 11/16/2020, 11/26/2020   Tdap 04/28/2009, 11/24/2018   Pertinent  Health Maintenance Due  Topic Date Due   INFLUENZA VACCINE   11/27/2022   PAP SMEAR-Modifier  11/11/2024      12/25/2019    2:37 PM 11/04/2021    8:24 AM 11/11/2021    8:29 AM 05/08/2022    1:26 PM 07/15/2022   10:30 AM  Fall Risk  Falls in the past year?  0 0 0 0  Was there an injury with Fall?  0 0 0 0  Fall Risk Category Calculator  0 0 0 0  Fall Risk Category (Retired)  Low Low Low   (RETIRED) Patient Fall Risk Level Low fall risk Low fall risk Low fall risk Low fall risk   Patient at Risk for Falls Due to  No Fall Risks No Fall Risks No Fall Risks No Fall Risks  Fall risk Follow up  Falls evaluation completed Falls evaluation completed Falls evaluation completed Falls evaluation completed   Functional Status Survey:    There were no vitals filed for this visit. There is no height or weight on file to calculate BMI. Physical Exam Vitals reviewed.  Constitutional:      General: She is not in acute distress.    Appearance: Normal appearance. She is not ill-appearing or diaphoretic.  Pulmonary:     Effort: Pulmonary effort is normal. No respiratory distress.  Neurological:     Mental Status: She is alert and oriented to person, place, and time.     Sensory: No sensory deficit.     Gait: Gait normal.  Psychiatric:        Mood and Affect: Mood normal.        Speech: Speech normal.        Behavior: Behavior normal.     Labs reviewed: Recent Labs    11/11/21 1013 05/12/22 0809  NA 139 138  K 4.7 4.4  CL 104 105  CO2 27 27  GLUCOSE 76 80  BUN 14 11  CREATININE 0.79 0.81  CALCIUM 9.6 9.5   Recent Labs    11/11/21 1013 05/12/22 0809 08/07/22 0000  AST 22 21 16   ALT 14 18 16   ALKPHOS  --   --  59  BILITOT 0.4 0.6  --   PROT 7.3 6.7  --   ALBUMIN  --   --  4.2   Recent Labs    11/11/21 1013 05/12/22 0809  WBC 4.0 4.2  NEUTROABS 1,868 2,020  HGB 11.6* 12.5  HCT 36.5 36.9  MCV 84.9 89.8  PLT 426* 300   Lab Results  Component Value Date   TSH 1.58 05/12/2022   Lab Results  Component Value Date   HGBA1C 5.2  10/27/2019   Lab Results  Component Value Date   CHOL 192 08/07/2022   HDL 92 (A) 08/07/2022   LDLCALC 92 08/07/2022   TRIG 42 08/07/2022   CHOLHDL 2.2 05/12/2022    Significant Diagnostic Results in last 30 days:  US PELVIS (TRANSABDOMINAL ONLY)  Result Date: 10/01/2022 CLINICAL DATA:  Lower abdominal palpable mass. EXAM: TRANSABDOMINAL ULTRASOUND OF PELVIS TECHNIQUE: Transabdominal ultrasound examination of the pelvis was performed including evaluation of the uterus, ovaries, adnexal regions, and pelvic cul-de-sac. COMPARISON:  MRI of October 18, 2017. FINDINGS: Uterus Measurements: 17.8 x  15.4 x 10.4 cm = volume: 1488 mL. Multiple fibroids are noted. Endometrium Not visualized due to multiple fibroids. Right ovary Not visualized. Left ovary Measurements: 5.4 x 2.4 x 2.2 cm = volume: 15 mL. 3 cm cyst is noted. Other findings:  No abnormal free fluid. IMPRESSION: Severely enlarged fibroid uterus is noted. 3 cm left ovarian cyst is noted. No followup imaging recommended. Note: This recommendation does not apply to premenarchal patients or to those with increased risk (genetic, family history, elevated tumor markers or other high-risk factors) of ovarian cancer. Reference: Radiology 2019 Nov; 293(2):359-371. Electronically Signed   By: Lupita Raider M.D.   On: 10/01/2022 09:26    Assessment/Plan  1. Uterine leiomyoma, unspecified location Ultrasound done 09/30/2022 showed severely enlarged fibroid uterus and 3 cm left ovarian cyst.  Results discussed with patient option given for referral to General surgery or gynecology for further evaluation.states does not want any surgery would like referral to gynecologist.  - Ambulatory referral to Gynecology  2. Cyst of left ovary 3 cm left ovarian cyst note on abdominal ultrasound.  - Ambulatory referral to Gynecology  Family/ staff Communication: Reviewed plan of care with patient verbalized understanding  Labs/tests ordered: None   Next  Appointment: Return if symptoms worsen or fail to improve.   I connected with  Anita Padilla on 10/03/22 by a video enabled telemedicine application and verified that I am speaking with the correct person using two identifiers.   I discussed the limitations of evaluation and management by telemedicine. The patient expressed understanding and agreed to proceed.   Spent 11 minutes of face to face with patient  >50% time spent counseling; reviewing medical record; tests; labs; and developing future plan of care.   Caesar Bookman, NP

## 2022-10-06 ENCOUNTER — Telehealth: Payer: Self-pay | Admitting: Internal Medicine

## 2022-10-06 DIAGNOSIS — E041 Nontoxic single thyroid nodule: Secondary | ICD-10-CM

## 2022-10-06 NOTE — Telephone Encounter (Signed)
Please let the patient know that thyroid ultrasound continues to show right thyroid nodule, the nodule has enlarged from prior ultrasounds and it does meet the criteria for biopsy.   Please take permission from the patient for me to place an order for biopsy   She has had 2 in the past, so she is familiar with the process, it would be done at the same place where the ultrasound was performed  Thanks

## 2022-10-06 NOTE — Telephone Encounter (Signed)
LMTCB and sent mychart message

## 2022-10-15 NOTE — Telephone Encounter (Signed)
Referral to sx has been placed

## 2022-11-04 LAB — HEPATIC FUNCTION PANEL
ALT: 15 U/L (ref 7–35)
AST: 26 (ref 13–35)
Alkaline Phosphatase: 61 (ref 25–125)
Bilirubin, Direct: 0.14
Bilirubin, Total: 0.4

## 2022-11-04 LAB — LIPID PANEL
Cholesterol: 198 (ref 0–200)
HDL: 87 — AB (ref 35–70)
LDL Cholesterol: 102
Triglycerides: 49 (ref 40–160)

## 2022-11-04 LAB — COMPREHENSIVE METABOLIC PANEL: Albumin: 4 (ref 3.5–5.0)

## 2022-11-06 ENCOUNTER — Ambulatory Visit: Payer: Commercial Managed Care - PPO | Admitting: Family

## 2023-09-03 DIAGNOSIS — L700 Acne vulgaris: Secondary | ICD-10-CM | POA: Diagnosis not present

## 2023-09-03 DIAGNOSIS — L708 Other acne: Secondary | ICD-10-CM | POA: Diagnosis not present

## 2023-09-10 ENCOUNTER — Ambulatory Visit: Payer: PRIVATE HEALTH INSURANCE | Admitting: Family

## 2023-09-22 ENCOUNTER — Encounter: Payer: PRIVATE HEALTH INSURANCE | Admitting: Family

## 2023-09-27 NOTE — Progress Notes (Signed)
   This encounter was created in error - please disregard. No show

## 2023-09-29 ENCOUNTER — Ambulatory Visit: Payer: PRIVATE HEALTH INSURANCE | Admitting: Family

## 2023-09-29 ENCOUNTER — Encounter: Payer: Self-pay | Admitting: Family

## 2023-09-29 VITALS — BP 120/78 | HR 75 | Temp 97.9°F | Ht 68.0 in | Wt 227.8 lb

## 2023-09-29 DIAGNOSIS — E782 Mixed hyperlipidemia: Secondary | ICD-10-CM

## 2023-09-29 DIAGNOSIS — E041 Nontoxic single thyroid nodule: Secondary | ICD-10-CM | POA: Diagnosis not present

## 2023-09-29 DIAGNOSIS — F419 Anxiety disorder, unspecified: Secondary | ICD-10-CM | POA: Diagnosis not present

## 2023-09-29 DIAGNOSIS — F32A Depression, unspecified: Secondary | ICD-10-CM | POA: Diagnosis not present

## 2023-09-29 DIAGNOSIS — R5383 Other fatigue: Secondary | ICD-10-CM

## 2023-09-29 DIAGNOSIS — D259 Leiomyoma of uterus, unspecified: Secondary | ICD-10-CM

## 2023-09-29 LAB — CBC WITH DIFFERENTIAL/PLATELET
Absolute Lymphocytes: 1620 {cells}/uL (ref 850–3900)
Absolute Monocytes: 510 {cells}/uL (ref 200–950)
Basophils Absolute: 20 {cells}/uL (ref 0–200)
Basophils Relative: 0.4 %
Eosinophils Absolute: 120 {cells}/uL (ref 15–500)
Eosinophils Relative: 2.4 %
HCT: 36.6 % (ref 35.0–45.0)
Hemoglobin: 11.5 g/dL — ABNORMAL LOW (ref 11.7–15.5)
MCH: 28.1 pg (ref 27.0–33.0)
MCHC: 31.4 g/dL — ABNORMAL LOW (ref 32.0–36.0)
MCV: 89.5 fL (ref 80.0–100.0)
MPV: 10.2 fL (ref 7.5–12.5)
Monocytes Relative: 10.2 %
Neutro Abs: 2730 {cells}/uL (ref 1500–7800)
Neutrophils Relative %: 54.6 %
Platelets: 295 10*3/uL (ref 140–400)
RBC: 4.09 10*6/uL (ref 3.80–5.10)
RDW: 13.2 % (ref 11.0–15.0)
Total Lymphocyte: 32.4 %
WBC: 5 10*3/uL (ref 3.8–10.8)

## 2023-09-29 LAB — COMPLETE METABOLIC PANEL WITHOUT GFR
AG Ratio: 1.6 (calc) (ref 1.0–2.5)
ALT: 15 U/L (ref 6–29)
AST: 19 U/L (ref 10–35)
Albumin: 4.1 g/dL (ref 3.6–5.1)
Alkaline phosphatase (APISO): 45 U/L (ref 31–125)
BUN: 11 mg/dL (ref 7–25)
CO2: 28 mmol/L (ref 20–32)
Calcium: 9.4 mg/dL (ref 8.6–10.2)
Chloride: 101 mmol/L (ref 98–110)
Creat: 0.75 mg/dL (ref 0.50–0.99)
Globulin: 2.6 g/dL (ref 1.9–3.7)
Glucose, Bld: 69 mg/dL (ref 65–99)
Potassium: 4.3 mmol/L (ref 3.5–5.3)
Sodium: 135 mmol/L (ref 135–146)
Total Bilirubin: 0.5 mg/dL (ref 0.2–1.2)
Total Protein: 6.7 g/dL (ref 6.1–8.1)

## 2023-09-29 LAB — LIPID PANEL
Cholesterol: 203 mg/dL — ABNORMAL HIGH (ref <200)
HDL: 103 mg/dL (ref >=50)
LDL Cholesterol (Calc): 89 mg/dL
Non-HDL Cholesterol (Calc): 100 mg/dL (ref <130)
Total CHOL/HDL Ratio: 2 (calc) (ref <5.0)
Triglycerides: 37 mg/dL (ref <150)

## 2023-09-29 LAB — TSH: TSH: 1.46 m[IU]/L

## 2023-09-29 NOTE — Progress Notes (Signed)
 Provider: Christean Courts FNP-C   Karisa Nesser, Elijio Guadeloupe, NP  Patient Care Team: Koltan Portocarrero, Elijio Guadeloupe, NP as PCP - General (Family Medicine)  Extended Emergency Contact Information Primary Emergency Contact: Smaltz,Dolly Mobile Phone: [phone] Relation: Mother Interpreter needed? No Secondary Emergency Contact: CLAYTON,ANGELA Mobile Phone: [phone] Relation: Sister  Code Status:  Full Code  Goals of care: Advanced Directive information    05/08/2022    1:27 PM  Advanced Directives  Does Patient Have a Medical Advance Directive? No  Would patient like information on creating a medical advance directive? No - Patient declined     Chief Complaint  Patient presents with   Medical Management of Chronic Issues    Blood work including thyroid  panel. Extreme fatigue Increase of appetite Hormone imbalance possible pre menopausal    Discussed the use of AI scribe software for clinical note transcription with the patient, who gave verbal consent to proceed.  History of Present Illness   Anita Padilla is a 48 year old female who presents for one year follow up. She complains of fatigue and sugar cravings.  She experiences persistent fatigue and significant sugar cravings. She has not been exercising for the past few months, although she was previously consistent with her workouts for a year. She attributes her fatigue to aging-related changes.  She has a thyroid  nodule that was evaluated with an ultrasound and biopsy about a year ago. An endocrinologist is currently monitoring the nodule, and she feels that its size has remained stable.  She has a history of fibroids but has not yet consulted a gynecologist for this issue. Her menstrual periods are regular, and she denies any abnormal bleeding. She notes abdominal firmness around the time of her period, which she attributes to the fibroids. No changes in voice, pain in the forehead, or tenderness in the back.     Past Medical  History:  Diagnosis Date   Anxiety    Back pain    Borderline hypertension    Depression    Dysmenorrhea    Fibroid    uterine   Hyperlipidemia    Joint pain    Lower extremity edema    Ovarian cyst    Seasonal allergies    Thyroid  nodule    Thyroid  nodule    Past Surgical History:  Procedure Laterality Date   CERVICAL CERCLAGE      No Known Allergies  Allergies as of 09/29/2023   No Known Allergies      Medication List        Accurate as of September 29, 2023  1:42 PM. If you have any questions, ask your nurse or doctor.          STOP taking these medications    DHEA 25 MG Caps Stopped by: Amery Minasyan C Sharlene Mccluskey   ISOtretinoin 40 MG capsule Commonly known as: ACCUTANE Stopped by: Laura-Lee Villegas C Barlow Harrison   progesterone 200 MG capsule Commonly known as: PROMETRIUM Stopped by: Sergei Delo C Ezri Landers       TAKE these medications    multivitamin with minerals tablet Take 1 tablet by mouth daily.   spironolactone 50 MG tablet Commonly known as: ALDACTONE Take 50 mg by mouth daily.        Review of Systems  Constitutional:  Positive for fatigue. Negative for appetite change, chills, fever and unexpected weight change.  HENT:  Negative for congestion, dental problem, ear discharge, ear pain, facial swelling, hearing loss, nosebleeds, postnasal drip, rhinorrhea, sinus pressure, sinus pain, sneezing, sore throat, tinnitus and  trouble swallowing.   Eyes:  Negative for pain, discharge, redness, itching and visual disturbance.  Respiratory:  Negative for cough, chest tightness, shortness of breath and wheezing.   Cardiovascular:  Negative for chest pain, palpitations and leg swelling.  Gastrointestinal:  Negative for abdominal distention, abdominal pain, blood in stool, constipation, diarrhea, nausea and vomiting.  Endocrine: Negative for cold intolerance, heat intolerance, polydipsia, polyphagia and polyuria.  Genitourinary:  Negative for difficulty urinating, dysuria, flank pain,  frequency and urgency.  Musculoskeletal:  Negative for arthralgias, back pain, gait problem, joint swelling, myalgias, neck pain and neck stiffness.  Skin:  Negative for color change, pallor, rash and wound.  Neurological:  Negative for dizziness, syncope, speech difficulty, weakness, light-headedness, numbness and headaches.  Hematological:  Does not bruise/bleed easily.  Psychiatric/Behavioral:  Negative for agitation, behavioral problems, confusion, hallucinations, self-injury, sleep disturbance and suicidal ideas. The patient is not nervous/anxious.     Immunization History  Administered Date(s) Administered   Influenza-Unspecified 03/28/2016, 01/29/2022   PFIZER(Purple Top)SARS-COV-2 Vaccination 04/07/2020, 04/30/2020   PPD Test 11/25/2019, 11/16/2020, 11/26/2020   Tdap 04/28/2009, 11/24/2018   Pertinent  Health Maintenance Due  Topic Date Due   Colonoscopy  Never done   INFLUENZA VACCINE  11/27/2023      12/25/2019    2:37 PM 11/04/2021    8:24 AM 11/11/2021    8:29 AM 05/08/2022    1:26 PM 07/15/2022   10:30 AM  Fall Risk  Falls in the past year?  0 0 0 0  Was there an injury with Fall?  0 0 0 0  Fall Risk Category Calculator  0 0 0 0  Fall Risk Category (Retired)  Low Low Low   (RETIRED) Patient Fall Risk Level Low fall risk Low fall risk Low fall risk Low fall risk   Patient at Risk for Falls Due to  No Fall Risks No Fall Risks No Fall Risks No Fall Risks  Fall risk Follow up  Falls evaluation completed Falls evaluation completed Falls evaluation completed Falls evaluation completed   Functional Status Survey:    Vitals:   09/29/23 1259  BP: 120/78  Pulse: 75  Temp: 97.9 F (36.6 C)  SpO2: 96%  Weight: 227 lb 12.8 oz (103.3 kg)  Height: 5\' 8"  (1.727 m)   Body mass index is 34.64 kg/m. Physical Exam GENERAL: Alert, cooperative, well developed, no acute distress HEENT: Normocephalic, normal oropharynx, moist mucous membranes, no frontal or maxillary sinus  tenderness NECK: Neck supple, thyroid  gland enlarged CHEST: Clear to auscultation bilaterally, no wheezes, rhonchi, or crackles CARDIOVASCULAR: Normal heart rate and rhythm, S1 and S2 normal without murmurs ABDOMEN: Soft, non-tender, non-distended, without organomegaly, normal bowel sounds. Lower abdomen firm to palpation  EXTREMITIES: No cyanosis or edema NEUROLOGICAL: Cranial nerves grossly intact, moves all extremities without gross motor or sensory deficit      Labs reviewed: No results for input(s): "NA", "K", "CL", "CO2", "GLUCOSE", "BUN", "CREATININE", "CALCIUM", "MG", "PHOS" in the last 8760 hours. Recent Labs    11/04/22 0000  AST 26  ALT 15  ALKPHOS 61  ALBUMIN 4.0   No results for input(s): "WBC", "NEUTROABS", "HGB", "HCT", "MCV", "PLT" in the last 8760 hours. Lab Results  Component Value Date   TSH 1.58 05/12/2022   Lab Results  Component Value Date   HGBA1C 5.2 10/27/2019   Lab Results  Component Value Date   CHOL 198 11/04/2022   HDL 87 (A) 11/04/2022   LDLCALC 102 11/04/2022   TRIG 49 11/04/2022  CHOLHDL 2.2 05/12/2022    Significant Diagnostic Results in last 30 days:  No results found.  Assessment/Plan  Hyperlipidemia  dietary modification and exercise advised   Anxiety and depression  Mood stable  continue to monitor   Fatigue Persistent fatigue, sugar cravings, and weight gain. Recent lack of exercise may contribute. Evaluating potential causes including thyroid  dysfunction and anemia. - Order blood tests: cholesterol, thyroid  function tests, chemistry panel, and CBC.  Thyroid  nodule Thyroid  nodule evaluated with ultrasound and biopsy a year ago. Endocrinologist monitoring. Inquired about removal; advised to discuss with endocrinologist. - Contact endocrinologist to discuss potential surgery and schedule follow-up.  Uterine fibroids Uterine fibroids with regular periods and no abnormal bleeding. Abdominal firmness consistent with fibroids.  Monitoring advised to prevent anemia and potential growth outside the uterus. - Refer to gynecologist for evaluation and monitoring of fibroids.  Family/ staff Communication: Reviewed plan of care with patient verbalized understanding  Labs/tests ordered:  - CBC with Differential/Platelet - CMP with eGFR([facility]) - TSH - Lipid panel  Next Appointment : Return in about 6 months (around 03/30/2024) for medical mangement of chronic issues.Aaron Aas   Spent 20 minutes of Face to face and non-face to face with patient  >50% time spent counseling; reviewing medical record; tests; labs; documentation and developing future plan of care.   Estil Heman, NP

## 2023-09-30 ENCOUNTER — Telehealth: Payer: Self-pay

## 2023-09-30 DIAGNOSIS — E041 Nontoxic single thyroid nodule: Secondary | ICD-10-CM

## 2023-09-30 NOTE — Telephone Encounter (Signed)
 Patient needs a new referral sent for Dr. Sofia Dunn as they previous one has expired.

## 2023-10-02 ENCOUNTER — Other Ambulatory Visit: Payer: Self-pay | Admitting: Family

## 2023-10-02 ENCOUNTER — Ambulatory Visit: Payer: Self-pay | Admitting: Family

## 2023-10-02 DIAGNOSIS — D509 Iron deficiency anemia, unspecified: Secondary | ICD-10-CM

## 2023-10-02 MED ORDER — FERROUS SULFATE 325 (65 FE) MG PO TBEC
325.0000 mg | DELAYED_RELEASE_TABLET | Freq: Every day | ORAL | 1 refills | Status: AC
Start: 2023-10-02 — End: ?

## 2023-10-02 MED ORDER — DOCUSATE SODIUM 100 MG PO CAPS
100.0000 mg | ORAL_CAPSULE | Freq: Every day | ORAL | 3 refills | Status: AC
Start: 2023-10-02 — End: ?

## 2023-10-05 ENCOUNTER — Telehealth: Payer: Self-pay

## 2023-10-05 ENCOUNTER — Telehealth: Payer: Self-pay | Admitting: Family

## 2023-10-05 NOTE — Telephone Encounter (Signed)
 Please review.  Copied from CRM [id number]. Topic: Referral - Question >> Oct 05, 2023  3:47 PM Adrianna P wrote: Reason for CRM: April from gyn called, they cancelled referral, patient needs to be sent to a regular gyn

## 2023-10-05 NOTE — Telephone Encounter (Signed)
 Called provider office Dinah Ngetich NP... explained to the representative there Adrianna that the referral was to Gynecology not Gynecology oncology.  Cancelled referral in the workque.

## 2023-10-06 ENCOUNTER — Encounter: Payer: Self-pay | Admitting: Family

## 2023-10-06 ENCOUNTER — Ambulatory Visit (INDEPENDENT_AMBULATORY_CARE_PROVIDER_SITE_OTHER): Admitting: Family

## 2023-10-06 VITALS — BP 118/86 | HR 74 | Temp 97.6°F | Resp 16 | Ht 68.0 in | Wt 231.6 lb

## 2023-10-06 DIAGNOSIS — F32A Depression, unspecified: Secondary | ICD-10-CM

## 2023-10-06 DIAGNOSIS — Z5181 Encounter for therapeutic drug level monitoring: Secondary | ICD-10-CM

## 2023-10-06 DIAGNOSIS — F419 Anxiety disorder, unspecified: Secondary | ICD-10-CM | POA: Diagnosis not present

## 2023-10-06 MED ORDER — LORAZEPAM 0.5 MG PO TABS
0.5000 mg | ORAL_TABLET | Freq: Two times a day (BID) | ORAL | 3 refills | Status: AC | PRN
Start: 2023-10-06 — End: ?

## 2023-10-06 NOTE — Progress Notes (Signed)
 Provider: Christean Courts FNP-C  Tahj Lindseth, Elijio Guadeloupe, NP  Patient Care Team: Zinia Innocent, Elijio Guadeloupe, NP as PCP - General (Family Medicine)  Extended Emergency Contact Information Primary Emergency Contact: Stcharles,Dolly Mobile Phone: [phone] Relation: Mother Interpreter needed? No Secondary Emergency Contact: CLAYTON,ANGELA Mobile Phone: [phone] Relation: Sister  Code Status:  Full Code  Goals of care: Advanced Directive information    05/08/2022    1:27 PM  Advanced Directives  Does Patient Have a Medical Advance Directive? No  Would patient like information on creating a medical advance directive? No - Patient declined     Chief Complaint  Patient presents with   Medication Assistance    Anti anxiety medication     Letter for School/Work    Letter excuse for [place] No duty     Discussed the use of AI scribe software for clinical note transcription with the patient, who gave verbal consent to proceed.  History of Present Illness   Anita Padilla is a 48 year old female who presents for a letter to be excused from jury duty due to anxiety and depression.  She experiences significant anxiety and depression, which have been exacerbated by a recent jury duty summons from [facility]. She feels unable to fulfill this obligation due to her mental health conditions.  She has a history of anxiety and depression and has tried several antidepressants in the past but currently prefers to manage her depression independently. She has recently purchased a SAD light for light therapy to help with her seasonal depression, which she describes as 'very, very bad' during the winter months. She engages in outdoor activities like walking her dog to improve her mood.  In 2020, she used Ativan  on a PRN basis, prescribed by a psychiatrist, to manage anxiety attacks following the death of her best friend and the onset of the COVID-19 pandemic. She has not used it since.  She feels  'very tired' and 'exhausted' and has a history of anxiety attacks. She also notes discomfort in social situations, stating she does not like being around people as it makes her 'really nervous'.  During the review of symptoms, she mentions occasional palpitations and tenderness in her abdomen due to menstrual cramps. She is currently awaiting a call from her gynecologist to schedule an appointment and has an upcoming appointment with an endocrinologist on June 25th for right goiter.    Past Medical History:  Diagnosis Date   Anxiety    Back pain    Borderline hypertension    Depression    Dysmenorrhea    Fibroid    uterine   Hyperlipidemia    Joint pain    Lower extremity edema    Ovarian cyst    Seasonal allergies    Thyroid  nodule    Thyroid  nodule    Past Surgical History:  Procedure Laterality Date   CERVICAL CERCLAGE      No Known Allergies  Outpatient Encounter Medications as of 10/06/2023  Medication Sig   docusate sodium  (COLACE) 100 MG capsule Take 1 capsule (100 mg total) by mouth daily.   ferrous sulfate  325 (65 FE) MG EC tablet Take 1 tablet (325 mg total) by mouth daily with breakfast.   LORazepam  (ATIVAN ) 0.5 MG tablet Take 1 tablet (0.5 mg total) by mouth 2 (two) times daily as needed for anxiety.   Multiple Vitamins-Minerals (MULTIVITAMIN WITH MINERALS) tablet Take 1 tablet by mouth daily.   spironolactone (ALDACTONE) 50 MG tablet Take 50 mg by mouth daily.   [  DISCONTINUED] norgestimate -ethinyl estradiol  (SPRINTEC 28) 0.25-35 MG-MCG tablet Take 1 tablet by mouth every evening. (Patient not taking: Reported on 06/07/2019)   [DISCONTINUED] promethazine  (PHENERGAN ) 12.5 MG tablet Take 1 tablet (12.5 mg total) by mouth every 6 (six) hours as needed for nausea or vomiting. (Patient not taking: Reported on 06/07/2019)   No facility-administered encounter medications on file as of 10/06/2023.    Review of Systems  Constitutional:  Positive for fatigue. Negative for  appetite change, chills, fever and unexpected weight change.  HENT:  Negative for congestion, dental problem, ear discharge, ear pain, facial swelling, hearing loss, nosebleeds, postnasal drip, rhinorrhea, sinus pressure, sinus pain, sneezing, sore throat, tinnitus and trouble swallowing.   Eyes:  Negative for pain, discharge, redness, itching and visual disturbance.  Respiratory:  Negative for cough, chest tightness, shortness of breath and wheezing.   Cardiovascular:  Negative for chest pain, palpitations and leg swelling.  Gastrointestinal:  Negative for abdominal distention, abdominal pain, blood in stool, constipation, diarrhea, nausea and vomiting.  Endocrine: Negative for cold intolerance, heat intolerance, polydipsia, polyphagia and polyuria.  Genitourinary:  Positive for menstrual problem. Negative for difficulty urinating, dysuria, flank pain, frequency and urgency.       Menstrual cramps  Musculoskeletal:  Negative for arthralgias, back pain, gait problem, joint swelling, myalgias, neck pain and neck stiffness.  Skin:  Negative for color change, pallor and rash.  Neurological:  Negative for dizziness, syncope, speech difficulty, weakness, light-headedness, numbness and headaches.  Hematological:  Does not bruise/bleed easily.  Psychiatric/Behavioral:  Negative for agitation, behavioral problems, confusion, hallucinations, self-injury, sleep disturbance and suicidal ideas. The patient is nervous/anxious.     Immunization History  Administered Date(s) Administered   Influenza-Unspecified 03/28/2016, 01/29/2022, 02/23/2023   PFIZER(Purple Top)SARS-COV-2 Vaccination 04/07/2020, 04/30/2020   PPD Test 11/25/2019, 11/16/2020, 11/26/2020   Tdap 04/28/2009, 11/24/2018   Pertinent  Health Maintenance Due  Topic Date Due   Colonoscopy  Never done   INFLUENZA VACCINE  11/27/2023      12/25/2019    2:37 PM 11/04/2021    8:24 AM 11/11/2021    8:29 AM 05/08/2022    1:26 PM 07/15/2022   10:30  AM  Fall Risk  Falls in the past year?  0 0 0 0  Was there an injury with Fall?  0 0 0 0  Fall Risk Category Calculator  0 0 0 0  Fall Risk Category (Retired)  Low Low Low   (RETIRED) Patient Fall Risk Level Low fall risk Low fall risk Low fall risk Low fall risk   Patient at Risk for Falls Due to  No Fall Risks No Fall Risks No Fall Risks No Fall Risks  Fall risk Follow up  Falls evaluation completed Falls evaluation completed Falls evaluation completed Falls evaluation completed   Functional Status Survey:    Vitals:   10/06/23 1301  BP: 118/86  Pulse: 74  Resp: 16  Temp: 97.6 F (36.4 C)  SpO2: 97%  Weight: 231 lb 9.6 oz (105.1 kg)  Height: 5\' 8"  (1.727 m)   Body mass index is 35.21 kg/m. Physical Exam Physical Exam   GENERAL: Alert, cooperative, well developed, no acute distress HEENT: Normocephalic, normal oropharynx, moist mucous membranes NECK: Neck mass protruding on right side CHEST: Clear to auscultation bilaterally, no wheezes, rhonchi, or crackles CARDIOVASCULAR: Normal heart rate and rhythm, S1 and S2 normal without murmurs ABDOMEN: Soft, tender due to menstrual cycle, non-distended, without organomegaly, normal bowel sounds EXTREMITIES: No cyanosis or edema NEUROLOGICAL: Cranial nerves  grossly intact, moves all extremities without gross motor or sensory deficit  SKIN: No rash,no lesion or erythema   PSYCHIATRY/BEHAVIORAL: Anxious    Labs reviewed: Recent Labs    09/29/23 1334  NA 135  K 4.3  CL 101  CO2 28  GLUCOSE 69  BUN 11  CREATININE 0.75  CALCIUM 9.4   Recent Labs    11/04/22 0000 09/29/23 1334  AST 26 19  ALT 15 15  ALKPHOS 61  --   BILITOT  --  0.5  PROT  --  6.7  ALBUMIN 4.0  --    Recent Labs    09/29/23 1334  WBC 5.0  NEUTROABS 2,730  HGB 11.5*  HCT 36.6  MCV 89.5  PLT 295   Lab Results  Component Value Date   TSH 1.46 09/29/2023   Lab Results  Component Value Date   HGBA1C 5.2 10/27/2019   Lab Results   Component Value Date   CHOL 203 (H) 09/29/2023   HDL 103 09/29/2023   LDLCALC 89 09/29/2023   TRIG 37 09/29/2023   CHOLHDL 2.0 09/29/2023    Significant Diagnostic Results in last 30 days:  No results found.  Assessment/Plan Anxiety and Depression Significant anxiety and depression exacerbated by a recent jury duty summons. Previous treatment included Ativan  for anxiety attacks and various antidepressants, which she discontinued. Experiences seasonal depression and uses a SAD light for therapy. Reports high scores on depression and anxiety screenings. Strong preference to avoid jury duty due to mental health conditions. Ativan  is a controlled substance, requiring a contract to use one pharmacy and notify the office if a psychiatrist prescribes it. A drug test is also required as part of the agreement. - Draft a letter to excuse her from jury duty due to anxiety and depression. - Discuss the controlled substance agreement for Ativan , including using one pharmacy and notifying the office if a psychiatrist prescribes it. - Conduct a drug test as part of the controlled substance agreement. - Recommend restarting therapy with a therapist.  Thyroid  Nodule Thyroid  nodule appears to be enlarging and causing asymmetry. Upcoming appointment with an endocrinologist to evaluate the nodule. Thyroid  issues can sometimes contribute to anxiety symptoms. - Encourage follow-up with the endocrinologist for evaluation of the thyroid  nodule.  Menstrual Cramps Reports tenderness in the abdomen due to menstrual cramps. Currently on her menstrual cycle and awaiting a call from the gynecologist to schedule an appointment. - Await follow-up from the gynecologist for further evaluation of menstrual cramps.   Family/ staff Communication: Reviewed plan of care with patient verbalized understanding   Labs/tests ordered:  Urine drug test   Next Appointment: Return if symptoms worsen or fail to improve.   Total  time: 20 minutes. Greater than 50% of total time spent doing patient education regarding Generalized anxiety,depression ,Thyroid  nodule ,Menstrual cramps and health maintenance including symptom/medication management.   Estil Heman, NP

## 2023-10-07 LAB — DRUG MONITORING, PANEL 6 WITH CONFIRMATION, URINE
6 Acetylmorphine: NEGATIVE ng/mL (ref <10)
Alcohol Metabolites: NEGATIVE ng/mL (ref <500)
Amphetamines: NEGATIVE ng/mL (ref <500)
Barbiturates: NEGATIVE ng/mL (ref <300)
Benzodiazepines: NEGATIVE ng/mL (ref <100)
Cocaine Metabolite: NEGATIVE ng/mL (ref <150)
Creatinine: 137.8 mg/dL (ref >=20.0)
Marijuana Metabolite: NEGATIVE ng/mL (ref <20)
Methadone Metabolite: NEGATIVE ng/mL (ref <100)
Opiates: NEGATIVE ng/mL (ref <100)
Oxidant: NEGATIVE ug/mL (ref <200)
Oxycodone: NEGATIVE ng/mL (ref <100)
Phencyclidine: NEGATIVE ng/mL (ref <25)
pH: 6.8 (ref 4.5–9.0)

## 2023-10-07 LAB — DM TEMPLATE

## 2023-10-08 ENCOUNTER — Other Ambulatory Visit: Payer: Self-pay | Admitting: Family

## 2023-10-08 ENCOUNTER — Ambulatory Visit: Payer: Self-pay | Admitting: Family

## 2023-10-08 DIAGNOSIS — Z1211 Encounter for screening for malignant neoplasm of colon: Secondary | ICD-10-CM

## 2023-10-21 ENCOUNTER — Ambulatory Visit: Payer: Self-pay | Admitting: Surgery

## 2023-10-21 DIAGNOSIS — E063 Autoimmune thyroiditis: Secondary | ICD-10-CM | POA: Diagnosis not present

## 2023-10-21 DIAGNOSIS — E041 Nontoxic single thyroid nodule: Secondary | ICD-10-CM | POA: Diagnosis not present

## 2023-11-18 DIAGNOSIS — Z1211 Encounter for screening for malignant neoplasm of colon: Secondary | ICD-10-CM | POA: Diagnosis not present

## 2023-12-08 DIAGNOSIS — Z1211 Encounter for screening for malignant neoplasm of colon: Secondary | ICD-10-CM | POA: Diagnosis not present

## 2023-12-08 DIAGNOSIS — K635 Polyp of colon: Secondary | ICD-10-CM | POA: Diagnosis not present

## 2023-12-14 ENCOUNTER — Other Ambulatory Visit (HOSPITAL_BASED_OUTPATIENT_CLINIC_OR_DEPARTMENT_OTHER): Payer: Self-pay | Admitting: Family

## 2023-12-14 DIAGNOSIS — Z1231 Encounter for screening mammogram for malignant neoplasm of breast: Secondary | ICD-10-CM

## 2023-12-15 ENCOUNTER — Encounter (HOSPITAL_BASED_OUTPATIENT_CLINIC_OR_DEPARTMENT_OTHER): Payer: Self-pay | Admitting: Radiology

## 2023-12-15 ENCOUNTER — Telehealth: Payer: Self-pay | Admitting: Obstetrics and Gynecology

## 2023-12-15 ENCOUNTER — Ambulatory Visit (HOSPITAL_BASED_OUTPATIENT_CLINIC_OR_DEPARTMENT_OTHER)
Admission: RE | Admit: 2023-12-15 | Discharge: 2023-12-15 | Disposition: A | Discharge status: Home / Self Care | Source: Ambulatory Visit | Attending: Family | Admitting: Family

## 2023-12-15 DIAGNOSIS — Z1231 Encounter for screening mammogram for malignant neoplasm of breast: Secondary | ICD-10-CM | POA: Diagnosis not present

## 2023-12-15 NOTE — Telephone Encounter (Signed)
 Patient was referred to our office, Called patient with no answer left a detailed message to return my call about an appointment

## 2024-02-02 ENCOUNTER — Encounter: Admitting: Obstetrics and Gynecology

## 2024-02-23 ENCOUNTER — Encounter: Admitting: Obstetrics and Gynecology

## 2024-03-30 ENCOUNTER — Ambulatory Visit: Payer: Self-pay | Admitting: Family

## 2024-05-05 ENCOUNTER — Ambulatory Visit: Admitting: Family

## 2024-05-13 ENCOUNTER — Ambulatory Visit: Admitting: Family

## 2024-06-03 ENCOUNTER — Ambulatory Visit: Admitting: Family
